# Patient Record
Sex: Female | Born: 1970 | Race: Black or African American | Hispanic: No | Marital: Married | Smoking: Never smoker
Health system: Southern US, Community
[De-identification: ages and names within clinical notes are randomized; demographics above are authoritative.]

## PROBLEM LIST (undated history)

## (undated) DIAGNOSIS — M2550 Pain in unspecified joint: Secondary | ICD-10-CM

## (undated) DIAGNOSIS — F419 Anxiety disorder, unspecified: Secondary | ICD-10-CM

## (undated) DIAGNOSIS — F32A Depression, unspecified: Secondary | ICD-10-CM

## (undated) DIAGNOSIS — K219 Gastro-esophageal reflux disease without esophagitis: Secondary | ICD-10-CM

## (undated) DIAGNOSIS — N943 Premenstrual tension syndrome: Secondary | ICD-10-CM

## (undated) DIAGNOSIS — R600 Localized edema: Secondary | ICD-10-CM

## (undated) DIAGNOSIS — K5900 Constipation, unspecified: Secondary | ICD-10-CM

## (undated) DIAGNOSIS — M549 Dorsalgia, unspecified: Secondary | ICD-10-CM

## (undated) DIAGNOSIS — I10 Essential (primary) hypertension: Secondary | ICD-10-CM

## (undated) HISTORY — PX: TUBAL LIGATION: SHX77

## (undated) HISTORY — DX: Anxiety disorder, unspecified: F41.9

## (undated) HISTORY — DX: Essential (primary) hypertension: I10

## (undated) HISTORY — DX: Dorsalgia, unspecified: M54.9

## (undated) HISTORY — DX: Pain in unspecified joint: M25.50

## (undated) HISTORY — PX: ABLATION: SHX5711

## (undated) HISTORY — DX: Constipation, unspecified: K59.00

## (undated) HISTORY — DX: Localized edema: R60.0

## (undated) HISTORY — DX: Premenstrual tension syndrome: N94.3

## (undated) HISTORY — DX: Gastro-esophageal reflux disease without esophagitis: K21.9

## (undated) HISTORY — PX: TONSILLECTOMY: SUR1361

## (undated) HISTORY — DX: Depression, unspecified: F32.A

---

## 2004-02-07 ENCOUNTER — Other Ambulatory Visit: Admission: RE | Admit: 2004-02-07 | Discharge: 2004-02-07 | Payer: Self-pay | Admitting: Obstetrics and Gynecology

## 2004-04-16 ENCOUNTER — Ambulatory Visit (HOSPITAL_COMMUNITY): Admission: RE | Admit: 2004-04-16 | Discharge: 2004-04-16 | Payer: Self-pay | Admitting: Obstetrics and Gynecology

## 2004-04-16 ENCOUNTER — Encounter (INDEPENDENT_AMBULATORY_CARE_PROVIDER_SITE_OTHER): Payer: Self-pay | Admitting: Specialist

## 2005-08-14 ENCOUNTER — Other Ambulatory Visit: Admission: RE | Admit: 2005-08-14 | Discharge: 2005-08-14 | Payer: Self-pay | Admitting: Obstetrics and Gynecology

## 2005-08-26 ENCOUNTER — Inpatient Hospital Stay (HOSPITAL_COMMUNITY): Admission: AD | Admit: 2005-08-26 | Discharge: 2005-08-26 | Payer: Self-pay | Admitting: Obstetrics and Gynecology

## 2005-09-10 ENCOUNTER — Ambulatory Visit (HOSPITAL_COMMUNITY): Admission: RE | Admit: 2005-09-10 | Discharge: 2005-09-10 | Payer: Self-pay | Admitting: Obstetrics and Gynecology

## 2005-09-10 ENCOUNTER — Encounter (INDEPENDENT_AMBULATORY_CARE_PROVIDER_SITE_OTHER): Payer: Self-pay | Admitting: *Deleted

## 2008-09-15 ENCOUNTER — Ambulatory Visit: Payer: Self-pay | Admitting: Sports Medicine

## 2008-09-15 DIAGNOSIS — M65839 Other synovitis and tenosynovitis, unspecified forearm: Secondary | ICD-10-CM

## 2008-09-15 DIAGNOSIS — M65849 Other synovitis and tenosynovitis, unspecified hand: Secondary | ICD-10-CM

## 2008-09-15 HISTORY — DX: Other synovitis and tenosynovitis, unspecified forearm: M65.839

## 2009-05-04 ENCOUNTER — Encounter: Payer: Self-pay | Admitting: Family Medicine

## 2010-11-16 NOTE — H&P (Signed)
NAMESANDRIKA, Nicole Barber              ACCOUNT NO.:  [id number]   MEDICAL RECORD NO.:  [id number]          PATIENT TYPE:  AMB   LOCATION:  SDC                           FACILITY:  [facility]   PHYSICIAN:  Naima A. Dillard, M.D. DATE OF BIRTH:  03/07/1971   DATE OF ADMISSION:  DATE OF DISCHARGE:                                HISTORY & PHYSICAL   CHIEF COMPLAINT:  Menometrorrhagia and anemia.   HISTORY:  The patient presented in August of 2005 complaining of no menses  for 3 months, and then had a period which was very heavy, where she was  changing a pad every 30 minutes, and she said she slept with Chux on her bed  because of the vaginal bleeding.  She denied any chest pain or shortness of  breath.  She is not using any contraception.  She did not have any history  of fibroids or hormone therapy.  She did not have any new medications or  menopause symptoms or vaginal discharge.  She has had increased stress with  this problem, but no abdominal pain.  The patient recently had a  hysteroscopy and polypectomy.  D&C, hysteroscopy, polypectomy done in  October of 2005, which was significant for endometrial polyps.  The patient  had tried Provera and has been increased up to 40 mg a day, and, despite  that, has still have vaginal bleeding and anemia.  Her last hemoglobin  checked was actually 9.5.  The patient did have Trichomonas seen on her Pap  smear and was treated with Flagyl, but still had this increased bleeding.  The patient had an ultrasound which showed the uterus measuring 10.4 x 4.5 x  6.7.  No fibroids were seen.  The myometrium was slightly inhomogeneous with  some streaking, which could be significant for endomyosis.  The patient's  ovaries were noted to be normal.   PAST SURGICAL HISTORY:  1.  D&C, hysteroscopy, polypectomy in 2005.  2.  Laparoscopy tubal ligation.   PAST GYNECOLOGIC HISTORY:  Significant of menarche at age 41, lasting every  month 6-7.  The patient has a history  of Chlamydia and Trichomonas.   MEDICATIONS:  1.  Provera.  2.  Iron.   ALLERGIES:  No known drug allergies.   FAMILY HISTORY:  Unremarkable.  No GYN or breast cancer.   SOCIAL HISTORY:  The patient denies any alcohol, tobacco, drug or illicit  use.   PAST OBSTETRICAL HISTORY:  Significant for a vaginal delivery x3 without any  complications.   REVIEW OF SYSTEMS:  GENITOURINARY:  As above.  CARDIOVASCULAR:  Unremarkable.  GI:  Unremarkable.  RESPIRATORY:  Unremarkable.  MUSCULOSKELETAL:  Unremarkable.  PSYCHIATRIC:  Unremarkable.  ENDOCRINE:  Unremarkable.   PHYSICAL EXAMINATION:  VITAL SIGNS:  Blood pressure is 106/60.  The patient  had orthostatic blood pressures were normal in February of 2007.  HEENT:  Pupils equal, round and reactive to light.  Hearing is normal.  Throat is clear.  NECK:  Thyroid is not enlarged.  HEART:  Regular rate and rhythm.  CHEST:  Clear to auscultation bilaterally.  BREASTS:  No  masses or discharge, skin change or nipple retraction.  BACK:  No CVA tenderness bilaterally.  ABDOMEN:  Nontender without any masses or organomegaly.  EXTREMITIES:  No clubbing, cyanosis, or edema.  PELVIC:  Full vaginal exam was within normal limits.  Cervix was nontender  without any lesions.  Uterus is top normal size, nontender, with normal  shape.  Adnexa had no masses, nontender.   LABORATORY DATA:  Urine pregnancy test was negative.   ASSESSMENT:  Menometrorrhagia and anemia.   PLAN:  A D&C hysteroscopy, NovaSure ablation.  The patient was given all  options from observation to hormonal treatment to endometrial ablation to  birth control or hormonal forms of treatment to hysterectomy.  The patient  has chosen ablation.  She understands the risks are, but not limited to,  bleeding, infection, damage to internal organs such as bowel or bladder and  major blood vessels.      Naima A. Normand Sloop, M.D.  Electronically Signed     NAD/MEDQ  D:  09/09/2005  T:   09/10/2005  Job:  [id number]

## 2010-11-16 NOTE — Op Note (Signed)
Nicole Barber, Nicole Barber              ACCOUNT NO.:  [id number]   MEDICAL RECORD NO.:  [id number]          PATIENT TYPE:  AMB   LOCATION:  SDC                           [facility]ACILITY:  [facility]   PHYSICIAN:  Naima A. Dillard, M.D. DATE OF BIRTH:  [birth year]   DATE OF PROCEDURE:  04/16/2004  DATE OF DISCHARGE:                                 OPERATIVE REPORT   PREOPERATIVE DIAGNOSES:  Cystic mass in the endometrium.   POSTOPERATIVE DIAGNOSES:  Normal hysteroscopy with no masses noted.   PROCEDURE:  D&C hysteroscopy.   ANESTHESIA:  General laryngeal mask airway.   SURGEON:  Naima A. Dillard, M.D.   ESTIMATED BLOOD LOSS:  Minimal.   IV FLUIDS:  300 mL.   COMPLICATIONS:  None.   There was a 20 mL deficit of hysteroscopic fluid 3% sorbitol.   FINDINGS:  Top normal size anteverted uterus, mobile, nontender, no adnexal  masses palpated bilaterally. The uterus did sound to 10 cm.  10 mL of 1%  lidocaine was used for cervical block. No masses, no submucosal fibroids  noted. Both ostia were visualized. The patient went to the recovery room in  stable condition.   DESCRIPTION OF PROCEDURE:  The patient was taken to the operating room where  she was given general anesthesia with laryngeal mask airway, placed in the  dorsal lithotomy position, prepped and draped in the normal sterile fashion.  The catheter was drained with a red rubber catheter, patient was examined,  the findings noted above were seen. The anterior lip of the cervix was  grasped with a single tooth tenaculum, the uterus was then sounded to 10 cm.  The cervix was then dilated with Shawnie Pons dilators up to 21. The diagnostic  hysteroscope was then placed into the uterus cavity. Before any of this was  done, a bivalve speculum was placed into the vagina, before the anterior lip  of the cervix was grasped with a tenaculum. There were no submucosal  fibroids or cystic lesions noted, both ostia were visualized including the  cervix and  endocervix and endometrium. The hysteroscope was then removed,  sharp curettage was then done and a moderate amount of endometrial  curettings were obtained.  10 mL of 1% lidocaine  was used for cervical block for postop. All instruments were then removed  from the vagina, the tenaculum site was noted to be hemostatic. Sponge, lap  and needle counts were correct x2. The patient went to the recovery room in  stable condition.      NAD/MEDQ  D:  04/16/2004  T:  04/17/2004  Job:  [id number]

## 2010-11-16 NOTE — H&P (Signed)
Nicole Barber, Nicole Barber              ACCOUNT NO.:  [id number]   MEDICAL RECORD NO.:  [id number]          PATIENT TYPE:  AMB   LOCATION:  SDC                           [facility]ILITY[facility]   PHYSICIAN:  Naima A. Dillard, M.D. DATE OF BIRTH:  05-11-1971   DATE OF ADMISSION:  04/16/2004  DATE OF DISCHARGE:                                HISTORY & PHYSICAL   CHIEF COMPLAINT:  Cystic lesion in the endometrium   HISTORY AND PHYSICAL:  The patient is a 40 year old African American female,  gravida 3, para 3, who presented to me in August of 2005 complaining of  headaches and amenorrhea for three months with abdominal pain.  The patient  denied any vaginal discharge,  odor, or fever.  She did state that she had  irregular periods, denied any dyspareunia, did have some dysuria, no urgency  or frequency, no history of kidney stones.  She does have occasional  constipation, no diarrhea, occasional rectal bleeding, no nausea or  vomiting.  She says that she does not believe that she is pregnant.  No  history of fibroids or history of endometriosis.  The patient had no prior  ultrasound or evaluation.  Denies any history of any sexually transmitted  diseases before that visit.   PAST SURGICAL HISTORY:  Significant for a laparoscopic tubal ligation.   PAST GYNECOLOGIC HISTORY:  Significant for menarche at age 69, lasting every  month for six or seven days.  The patient did have a positive Chlamydia test  on February 07, 2004.  __________ is pending.   The patient is on no medications.   She has no known drug allergies.   FAMILY HISTORY:  Unremarkable.  No GYN cancer.   SOCIAL HISTORY:  The patient denies any alcohol, tobacco, or illicit drug  use.   PAST OBSTETRIC HISTORY:  Significant for vaginal delivery x3 without any  complications.   REVIEW OF SYSTEMS:  GENITOURINARY:  As above.  CARDIOVASCULAR:  Unremarkable.  GI:  Unremarkable.  RESPIRATORY:  Unremarkable.  MUSCULOSKELETAL:  Unremarkable.   PSYCHIATRIC:  Unremarkable.   GENERAL APPEARANCE:  Well-developed, well-nourished female in no apparent  distress.  Pupils are equal, hearing is normal, throat is clear.  Thyroid is  not enlarged.  HEART:  Regular rate and rhythm.  LUNGS:  Clear to auscultation bilaterally.  BREASTS:  No masses, discharge, skin changes, or nipple retraction  bilaterally.  BACK:  No CVA tenderness bilaterally.  ABDOMEN:  Nontender without any masses or organomegaly.  EXTREMITIES:  No clubbing, cyanosis, or edema.  NEUROLOGIC:  Exam is within normal limits.  PELVIC:  Vulva/vaginal exam is within normal limits.  Cervix nontender  without lesions.  Uterus is top normal size, shape, and consistency and  nontender.  Adnexa has no masses.   On ultrasound, uterus actually measured 14.5 x 3.6 x 6.8.  Endometrial  stripe is 1.4 cm with a cystic lesion in the endometrium.  This was after  the patient had withdrawal bleed.  Normal adnexae, just had a simple left  ovarian cyst, measuring 1.7 x 1.5 cm.   Her UPT was negative.  Her Chem 9 was also negative.   IMPRESSION:  Amenorrhea, with probable PCOS, with a cystic lesion in the  uterus, could be consistent with uterine hyperplasia.  The patient was  offered sonohystogram versus a D&C hysteroscopy.  The risks and benefits of  both were reviewed.  The patient decided on [facility] hysteroscopy with removal of  cystic lesion.  She understands the risks are, but not limited to bleeding,  infection, damage to internal organs like bowel, bladder, and major blood  vessels, and perforation of the uterus, also Asherman syndrome.      NAD/MEDQ  D:  04/16/2004  T:  04/16/2004  Job:  [id number]

## 2010-11-16 NOTE — Op Note (Signed)
NAMERICHEL, MILLSPAUGH              ACCOUNT NO.:  [id number]   MEDICAL RECORD NO.:  [id number]          PATIENT TYPE:  AMB   LOCATION:  SDC                           [facility]ACILITY:  [facility]   PHYSICIAN:  Naima A. Dillard, M.D. DATE OF BIRTH:  [birth year]   DATE OF PROCEDURE:  09/10/2005  DATE OF DISCHARGE:                                 OPERATIVE REPORT   PREOPERATIVE DIAGNOSES:  1.  Menometrorrhagia.  2.  Anemia.   POSTOPERATIVE DIAGNOSES:  1.  Menometrorrhagia.  2.  Anemia.   OPERATION/PROCEDURE:  1.  Dilatation and curettage and hysteroscopy.  2.  NovaSure ablation.   SURGEON:  Naima A. Dillard, M.D.   ASSISTANT:  None.   ANESTHESIA:  General and local.   SPECIMENS:  Endometrial curettings.   ESTIMATED BLOOD LOSS:  Minimal.   COMPLICATIONS:  None.   DISPOSITION:  The patient went to PACU in stable condition.   DESCRIPTION OF PROCEDURE:  The patient was taken to the operating room where  she was given general anesthesia, placed in the dorsal lithotomy position  and prepped and draped in the normal sterile fashion.  A bivalve speculum  was placed into the vagina.  The anterior lip of the cervix grasped with a  single-tooth tenaculum and 20 mL 1% lidocaine was used for cervical block.  The sounding length was 9 cm.  The cervical length was 4 cm.  Cavity length  was 5 cm.  Cervix was further dilated with Pratt dilators up to 21.  The  hysteroscope was placed into the uterine cavity.  There were no polyps or  fibroids noted.  Both ostia were visualized.  The hysteroscope was removed.  A sharp curettage was done and minimal endometrial curettings were sent to  pathology.  The NovaSure was then placed into the  uterine cavity and after  seating, the cavity length was 4.8.  The test was done to insure a seal  which was fine and the power was 132 and time of ablation was 1 minute 6  seconds.  The NovaSure was removed.  The tenaculum was removed from the  cervix.  There was some  bleeding noticed from the cervix which  was made hemostatic with pressure in silver nitrate.  All instruments were  removed from the vagina.  Sponge, lap and needle counts were correct x2.  The patient went to the recovery room in stable condition. A gonorrhea and  Chlamydia was done before the procedure and sent to the lab.      Naima A. Normand Sloop, M.D.  Electronically Signed     NAD/MEDQ  D:  09/10/2005  T:  09/11/2005  Job:  [id number]

## 2011-08-26 ENCOUNTER — Ambulatory Visit (HOSPITAL_COMMUNITY)
Admission: RE | Admit: 2011-08-26 | Discharge: 2011-08-26 | Disposition: A | Discharge status: Home / Self Care | Payer: BC Managed Care – PPO | Source: Ambulatory Visit | Attending: Family Medicine | Admitting: Family Medicine

## 2011-08-26 ENCOUNTER — Other Ambulatory Visit: Payer: Self-pay | Admitting: Family Medicine

## 2011-08-26 DIAGNOSIS — J40 Bronchitis, not specified as acute or chronic: Secondary | ICD-10-CM

## 2011-08-26 DIAGNOSIS — R05 Cough: Secondary | ICD-10-CM | POA: Insufficient documentation

## 2011-08-26 DIAGNOSIS — R059 Cough, unspecified: Secondary | ICD-10-CM | POA: Insufficient documentation

## 2012-03-10 ENCOUNTER — Other Ambulatory Visit: Payer: Self-pay | Admitting: Family Medicine

## 2012-03-10 DIAGNOSIS — G43909 Migraine, unspecified, not intractable, without status migrainosus: Secondary | ICD-10-CM

## 2012-03-13 ENCOUNTER — Other Ambulatory Visit (HOSPITAL_COMMUNITY): Payer: BC Managed Care – PPO

## 2012-03-17 ENCOUNTER — Ambulatory Visit (HOSPITAL_COMMUNITY)
Admission: RE | Admit: 2012-03-17 | Discharge: 2012-03-17 | Disposition: A | Discharge status: Home / Self Care | Payer: 59 | Source: Ambulatory Visit | Attending: Family Medicine | Admitting: Family Medicine

## 2012-03-17 DIAGNOSIS — R112 Nausea with vomiting, unspecified: Secondary | ICD-10-CM | POA: Insufficient documentation

## 2012-03-17 DIAGNOSIS — G43909 Migraine, unspecified, not intractable, without status migrainosus: Secondary | ICD-10-CM | POA: Insufficient documentation

## 2013-03-11 ENCOUNTER — Emergency Department (HOSPITAL_COMMUNITY)
Admission: EM | Admit: 2013-03-11 | Discharge: 2013-03-11 | Disposition: A | Discharge status: Home / Self Care | Payer: 59 | Attending: Emergency Medicine | Admitting: Emergency Medicine

## 2013-03-11 ENCOUNTER — Emergency Department (HOSPITAL_COMMUNITY): Payer: 59

## 2013-03-11 ENCOUNTER — Ambulatory Visit (INDEPENDENT_AMBULATORY_CARE_PROVIDER_SITE_OTHER): Payer: 59 | Admitting: Nurse Practitioner

## 2013-03-11 ENCOUNTER — Encounter (HOSPITAL_COMMUNITY): Payer: Self-pay

## 2013-03-11 ENCOUNTER — Encounter: Payer: Self-pay | Admitting: Nurse Practitioner

## 2013-03-11 VITALS — BP 120/78 | Temp 98.4°F | Ht 61.0 in | Wt 188.4 lb

## 2013-03-11 DIAGNOSIS — R52 Pain, unspecified: Secondary | ICD-10-CM

## 2013-03-11 DIAGNOSIS — R109 Unspecified abdominal pain: Secondary | ICD-10-CM

## 2013-03-11 DIAGNOSIS — Z3202 Encounter for pregnancy test, result negative: Secondary | ICD-10-CM | POA: Insufficient documentation

## 2013-03-11 DIAGNOSIS — Z79899 Other long term (current) drug therapy: Secondary | ICD-10-CM | POA: Insufficient documentation

## 2013-03-11 DIAGNOSIS — K5900 Constipation, unspecified: Secondary | ICD-10-CM

## 2013-03-11 DIAGNOSIS — Z9851 Tubal ligation status: Secondary | ICD-10-CM | POA: Insufficient documentation

## 2013-03-11 DIAGNOSIS — R1031 Right lower quadrant pain: Secondary | ICD-10-CM | POA: Insufficient documentation

## 2013-03-11 LAB — CBC WITH DIFFERENTIAL/PLATELET
Basophils Absolute: 0 10*3/uL (ref 0.0–0.1)
Eosinophils Absolute: 0.1 10*3/uL (ref 0.0–0.7)
Eosinophils Relative: 1 % (ref 0–5)
HCT: 42.2 % (ref 36.0–46.0)
Hemoglobin: 14.7 g/dL (ref 12.0–15.0)
MCH: 29.9 pg (ref 26.0–34.0)
MCHC: 34.8 g/dL (ref 30.0–36.0)
MCV: 85.9 fL (ref 78.0–100.0)
Neutro Abs: 5.4 10*3/uL (ref 1.7–7.7)
RDW: 12.9 % (ref 11.5–15.5)

## 2013-03-11 LAB — BASIC METABOLIC PANEL
Calcium: 9.3 mg/dL (ref 8.4–10.5)
Chloride: 104 mEq/L (ref 96–112)
Glucose, Bld: 108 mg/dL — ABNORMAL HIGH (ref 70–99)
Potassium: 3.7 mEq/L (ref 3.5–5.1)
Sodium: 138 mEq/L (ref 135–145)

## 2013-03-11 LAB — URINALYSIS, ROUTINE W REFLEX MICROSCOPIC
Bilirubin Urine: NEGATIVE
Glucose, UA: NEGATIVE mg/dL
Protein, ur: NEGATIVE mg/dL
Specific Gravity, Urine: 1.01 (ref 1.005–1.030)
Urobilinogen, UA: 1 mg/dL (ref 0.0–1.0)
pH: 7 (ref 5.0–8.0)

## 2013-03-11 LAB — URINE MICROSCOPIC-ADD ON

## 2013-03-11 MED ORDER — IOHEXOL 300 MG/ML  SOLN
50.0000 mL | Freq: Once | INTRAMUSCULAR | Status: AC | PRN
  Administered 2013-03-11: 50 mL via ORAL

## 2013-03-11 MED ORDER — SODIUM CHLORIDE 0.9 % IV SOLN
Freq: Once | INTRAVENOUS | Status: AC
  Administered 2013-03-11: 19:00:00 via INTRAVENOUS

## 2013-03-11 MED ORDER — IOHEXOL 300 MG/ML  SOLN
100.0000 mL | Freq: Once | INTRAMUSCULAR | Status: AC | PRN
  Administered 2013-03-11: 100 mL via INTRAVENOUS

## 2013-03-11 NOTE — ED Notes (Signed)
Pt c/o pain in lower abd x 3 days.  Reports nausea initially but none in the past 2 days.  Denies vomiting or diarrhea.   LBM was approx 1 1/2 weeks ago.  Pt says usually goes that long without having a bm.  Denies any abnormal vaginal bleeding or discharge.  LMP was 3 months ago.  Pt says had uterine ablation and doesn't have regular periods.

## 2013-03-11 NOTE — ED Notes (Signed)
X2 unsuccessful IV attempts made. Vernell Barrier RN to room to attempt IV access.

## 2013-03-11 NOTE — ED Provider Notes (Signed)
CSN: [id number]     Arrival date & time 03/11/13  1553 History  This chart was scribed for Geoffery Lyons, MD by Clydene Laming, ED Scribe and Bennett Scrape, ED Scribe. This patient was seen in room APA02/APA02 and the patient's care was started at 5:28 PM.   Chief Complaint  Patient presents with  . Abdominal Pain    The history is provided by the patient. No language interpreter was used.    HPI Comments: Nicole Barber is a 42 y.o. female who presents to the Emergency Department complaining of constant, sharp, lower abdominal pain that has been occuring for three days with associated constipation. She states her LBM was 1.5 weeks ago. Pt states she has experienced episodes of nausea but not for the past two days. Pt states she has not had a similar episode of this before. Pt denies trouble urinating, emesis, diarrhea, and fever, vaginal bleeding, or discharge. Menses are irregular at baseline due to ablation surgery. Last menses was 3 weeks ago.  History reviewed. No pertinent past medical history. Past Surgical History  Procedure Laterality Date  . Tubal ligation    . Ablation    . Tonsillectomy     No family history on file. History  Substance Use Topics  . Smoking status: Never Smoker   . Smokeless tobacco: Not on file  . Alcohol Use: No   No OB history provided.  Review of Systems  Constitutional: Negative for fever.  Gastrointestinal: Positive for abdominal pain and constipation. Negative for vomiting and diarrhea.  Genitourinary: Negative for vaginal bleeding, vaginal discharge and difficulty urinating.  All other systems reviewed and are negative.    Allergies  Review of patient's allergies indicates no known allergies.  Home Medications   Current Outpatient Rx  Name  Route  Sig  Dispense  Refill  . topiramate (TOPAMAX) 100 MG tablet   Oral   Take 100 mg by mouth 2 (two) times daily.          Triage Vitals: BP 139/83  Pulse 99  Temp(Src) 98.1 F (36.7  C) (Oral)  Resp 18  Ht 5\' 1"  (1.549 m)  Wt 181 lb (82.101 kg)  BMI 34.22 kg/m2  SpO2 98% Physical Exam  Nursing note and vitals reviewed. Constitutional: She is oriented to person, place, and time. She appears well-developed and well-nourished. No distress.  HENT:  Head: Normocephalic and atraumatic.  Eyes: Conjunctivae and EOM are normal.  Neck: Normal range of motion. Neck supple. No tracheal deviation present.  Cardiovascular: Normal rate, regular rhythm and normal heart sounds.   No murmur heard. Pulmonary/Chest: Effort normal and breath sounds normal. No respiratory distress. She has no wheezes. She has no rales.  Abdominal: Soft. Bowel sounds are normal. There is tenderness.  Ttp in the right lower quadrant without rebound or guarding. Bowel sounds present. No cva tenderness.   Musculoskeletal: Normal range of motion. She exhibits no edema.  Neurological: She is alert and oriented to person, place, and time. No cranial nerve deficit.  Skin: Skin is warm and dry.  Psychiatric: She has a normal mood and affect. Her behavior is normal.    ED Course  Procedures (including critical care time)  DIAGNOSTIC STUDIES: Oxygen Saturation is 98% on RA, normal by my interpretation.    COORDINATION OF CARE: 5:40 PM-  Pt declines pain medications. Discussed treatment plan with pt at bedside. Pt verbalized understanding and agreement with plan.   Labs Review Labs Reviewed  BASIC METABOLIC PANEL -  Abnormal; Notable for the following:    Glucose, Bld 108 (*)    All other components within normal limits  CBC WITH DIFFERENTIAL  URINALYSIS, ROUTINE W REFLEX MICROSCOPIC  PREGNANCY, URINE   Imaging Review No results found.  MDM  No diagnosis found. This patient presents with complaints of right lower abdominal pain for the past 3 days. She seen by her doctor and sent here for further workup. She denies any fevers or chills. She denies urinary complaints. There is no vaginal bleeding or  discharge. Exam reveals tenderness to palpation in the right lower quadrant. There is no rebound and no guarding. Workup is essentially unremarkable, including CBC, urinalysis, pregnancy test, white count. A CT of the abdomen and pelvis was performed which did not reveal appendicitis or other intra-abdominal pathology. She tells me she has not had a bowel movement in over one week. I suspect this may be the cause of her discomfort. I will recommend magnesium citrate and followup as needed should her symptoms worsen or change.   I personally performed the services described in this documentation, which was scribed in my presence. The recorded information has been reviewed and is accurate.     Geoffery Lyons, MD 03/11/13 2023

## 2013-03-11 NOTE — Progress Notes (Signed)
Subjective:  Presents with complaints of right-sided abdominal pain especially in the right lower quadrant that woke her up from a sleep 3 days ago. Describes as a constant ache with occasional very sharp pain 8/10 on pain scale. Comparison to labor pain. No fever. Nausea has resolved. No vomiting. Decreased appetite. Taking fluids well. No urinary symptoms. Her normal bowel pattern is twice every 2 weeks, her last BM was 1 1/2 weeks ago. Pain is worse with sitting and lying down. No back pain. No reflux symptoms. PMH includes tubal ligation and endometrial ablation.  Objective:   BP 120/78  Temp(Src) 98.4 F (36.9 C) (Oral)  Ht 5\' 1"  (1.549 m)  Wt 188 lb 6 oz (85.446 kg)  BMI 35.61 kg/m2 NAD. Alert, oriented. Patient in obvious pain, leaning back while sitting on table to avoid pressure on her right side. Lungs clear. Heart regular rate rhythm. Abdomen soft nondistended with hypoactive bowel sounds. Nontender left side to midline. Moderate tenderness right upper quadrant which is progressively worse towards the right lower quadrant which is exquisitely tender to palpation.  Assessment:Abdominal pain, acute  Plan: Due to the nature and severity of patient's pain, referred to local ER for further evaluation. A call was made to the triage nurse to make her aware that the patient will be coming over.

## 2014-07-29 ENCOUNTER — Ambulatory Visit (INDEPENDENT_AMBULATORY_CARE_PROVIDER_SITE_OTHER): Payer: BLUE CROSS/BLUE SHIELD | Admitting: Family Medicine

## 2014-07-29 ENCOUNTER — Encounter: Payer: Self-pay | Admitting: Family Medicine

## 2014-07-29 VITALS — BP 128/88 | Temp 98.5°F | Ht 61.0 in | Wt 193.0 lb

## 2014-07-29 DIAGNOSIS — J208 Acute bronchitis due to other specified organisms: Secondary | ICD-10-CM

## 2014-07-29 DIAGNOSIS — J4521 Mild intermittent asthma with (acute) exacerbation: Secondary | ICD-10-CM

## 2014-07-29 MED ORDER — BECLOMETHASONE DIPROPIONATE 80 MCG/ACT IN AERS: 2.0000 | INHALATION_SPRAY | Freq: Two times a day (BID) | RESPIRATORY_TRACT | Status: DC

## 2014-07-29 MED ORDER — LEVOFLOXACIN 500 MG PO TABS: 500.0000 mg | ORAL_TABLET | Freq: Every day | ORAL | Status: DC

## 2014-07-29 NOTE — Progress Notes (Signed)
   Subjective:    Patient ID: Nicole Barber, female    DOB: 09/12/1970, 44 y.o.   MRN: [id number]  Cough Associated symptoms include a fever, headaches and wheezing. Treatments tried: amoxil, biaxin, prednisone, inhaler, tessalon, cough syrup.  Went to urgent care 3 times. They did chest xray.  Discussed her illness is been going on for about 3 weeks start off and had been went to just intermittent chest congestion as well had x-ray was negative for any type of pneumonia she was first treated with amoxicillin then with Biaxin then with albuterol   Review of Systems  Constitutional: Positive for fever.  Respiratory: Positive for cough and wheezing.   Neurological: Positive for headaches.       Objective:   Physical Exam  Frequent cough is noted no respiratory distress noted heart regular. HEENT benign  Not respiratory distress    Assessment & Plan:  Recent sinus infection Bronchitis-Levaquin 14 days Possible asthmatic bronchitis Albuterol as rescue medicine Steroid inhaler on a regular basis follow-up 2 weeks May need pulmonary function testing on follow-up Have requested old records from recent visits to urgent care center including chest x-ray

## 2014-08-11 ENCOUNTER — Ambulatory Visit (INDEPENDENT_AMBULATORY_CARE_PROVIDER_SITE_OTHER): Payer: BLUE CROSS/BLUE SHIELD | Admitting: Family Medicine

## 2014-08-11 ENCOUNTER — Encounter: Payer: Self-pay | Admitting: Family Medicine

## 2014-08-11 VITALS — BP 112/70 | Ht 61.0 in | Wt 192.0 lb

## 2014-08-11 DIAGNOSIS — J208 Acute bronchitis due to other specified organisms: Secondary | ICD-10-CM

## 2014-08-11 NOTE — Progress Notes (Signed)
   Subjective:    Patient ID: Nicole Barber, female    DOB: October 16, 1970, 44 y.o.   MRN: [id number]  Cough  Pt is here today for a recheck on bronchitis from 2 weeks ago. She said she feels so much better, but still has lingering cough. Worst at night.  Taking meds prescribed.     Review of Systems  Respiratory: Positive for cough.    no fever no vomiting no diarrhea.     Objective:   Physical Exam   lungs are clear hearts regular pulse normal occasional cough noted      Assessment & Plan:   bronchitis much better this should gradually dissipate to no coughing over the next 2 weeks if worse call back we may need to send in some additional medication possibly Biaxin   to use the steroid inhaler couple times per day over the course of the next 4 weeks use albuterol when necessary

## 2014-08-17 ENCOUNTER — Telehealth: Payer: Self-pay | Admitting: Family Medicine

## 2014-08-17 MED ORDER — CLARITHROMYCIN 500 MG PO TABS: 500.0000 mg | ORAL_TABLET | Freq: Two times a day (BID) | ORAL | Status: DC

## 2014-08-17 NOTE — Telephone Encounter (Signed)
Medication was sent in to the pharmacy. Left message on voicemail notifying patient.

## 2014-08-17 NOTE — Telephone Encounter (Signed)
Pt states she had a follow up OV on 2/11 An was told to call back if her cough did not get any better  She would like something called into [facility]

## 2014-08-17 NOTE — Telephone Encounter (Signed)
Please send in Biaxin 500 mg, 1 twice a day for 10 days, I recommend for the patient to follow-up if she is not well with that. Certainly sooner if problems. Take medication with food and plenty of water

## 2014-11-09 ENCOUNTER — Encounter: Payer: Self-pay | Admitting: Family Medicine

## 2014-11-09 ENCOUNTER — Ambulatory Visit (INDEPENDENT_AMBULATORY_CARE_PROVIDER_SITE_OTHER): Payer: BLUE CROSS/BLUE SHIELD | Admitting: Family Medicine

## 2014-11-09 VITALS — BP 128/90 | Temp 98.5°F | Ht 61.0 in | Wt 192.0 lb

## 2014-11-09 DIAGNOSIS — G8929 Other chronic pain: Secondary | ICD-10-CM

## 2014-11-09 DIAGNOSIS — J4520 Mild intermittent asthma, uncomplicated: Secondary | ICD-10-CM | POA: Diagnosis not present

## 2014-11-09 DIAGNOSIS — J019 Acute sinusitis, unspecified: Secondary | ICD-10-CM | POA: Diagnosis not present

## 2014-11-09 DIAGNOSIS — R51 Headache: Secondary | ICD-10-CM | POA: Diagnosis not present

## 2014-11-09 DIAGNOSIS — J45909 Unspecified asthma, uncomplicated: Secondary | ICD-10-CM | POA: Insufficient documentation

## 2014-11-09 DIAGNOSIS — R519 Headache, unspecified: Secondary | ICD-10-CM

## 2014-11-09 DIAGNOSIS — B9689 Other specified bacterial agents as the cause of diseases classified elsewhere: Secondary | ICD-10-CM

## 2014-11-09 HISTORY — DX: Headache, unspecified: R51.9

## 2014-11-09 HISTORY — DX: Unspecified asthma, uncomplicated: J45.909

## 2014-11-09 MED ORDER — LEVOFLOXACIN 500 MG PO TABS: 500.0000 mg | ORAL_TABLET | Freq: Every day | ORAL | Status: DC

## 2014-11-09 MED ORDER — ALBUTEROL SULFATE HFA 108 (90 BASE) MCG/ACT IN AERS: 2.0000 | INHALATION_SPRAY | Freq: Four times a day (QID) | RESPIRATORY_TRACT | Status: DC | PRN

## 2014-11-09 MED ORDER — FLUTICASONE PROPIONATE 50 MCG/ACT NA SUSP: 2.0000 | Freq: Every day | NASAL | Status: DC

## 2014-11-09 NOTE — Progress Notes (Signed)
   Subjective:    Patient ID: Nicole Barber, female    DOB: 12/08/1970, 44 y.o.   MRN: [id number]  Cough This is a new problem. Episode onset: 5 days. Associated symptoms include ear pain, a fever, headaches, nasal congestion, rhinorrhea and a sore throat. Pertinent negatives include no chest pain, shortness of breath or wheezing. Treatments tried: zyrtec, alka seltzer.   She is complaining of sinus pressure pain and discomfort Using zyrtec and alka seltzer plus Patient has relatively frequent head congestion and coughing  Review of Systems  Constitutional: Positive for fever. Negative for activity change.  HENT: Positive for congestion, ear pain, rhinorrhea and sore throat.   Eyes: Negative for discharge.  Respiratory: Positive for cough. Negative for shortness of breath and wheezing.   Cardiovascular: Negative for chest pain.  Neurological: Positive for headaches.       Objective:   Physical Exam  Constitutional: She appears well-developed.  HENT:  Head: Normocephalic.  Nose: Nose normal.  Mouth/Throat: Oropharynx is clear and moist. No oropharyngeal exudate.  Neck: Neck supple.  Cardiovascular: Normal rate and normal heart sounds.   No murmur heard. Pulmonary/Chest: Effort normal and breath sounds normal. She has no wheezes.  Lymphadenopathy:    She has no cervical adenopathy.  Skin: Skin is warm and dry.  Nursing note and vitals reviewed.         Assessment & Plan:  Patient overall doing fairly well. I am concerned about reoccurrence of lung related issues. Apparently she only use the steroid inhaler for a week or 2 after the last illness then stopped it I told her she ought to take it on a regular basis for at least the next few months  Allergic rhinitis OTC medications ear tack along with Flonase Antibiotics for sinusitis Impersistence of illness may need shot of steroids or a short taper of oral steroids patient will call back

## 2015-06-21 ENCOUNTER — Telehealth: Payer: Self-pay | Admitting: Family Medicine

## 2015-06-21 NOTE — Telephone Encounter (Signed)
Patient last seen 5/16-needs office visit

## 2015-06-21 NOTE — Telephone Encounter (Signed)
Left message to return call 

## 2015-06-21 NOTE — Telephone Encounter (Signed)
Patient scheduled office visit 06/22/15

## 2015-06-21 NOTE — Telephone Encounter (Signed)
Pt had lots of bronchitis issues from 05/2014-10/2014, now has cough for 2 weeks, using inhaler, not clearing up Would like to know if we can call in a 5 day Prednisone taper?  Please advise   [facility]

## 2015-06-22 ENCOUNTER — Encounter: Payer: Self-pay | Admitting: Family Medicine

## 2015-06-22 ENCOUNTER — Ambulatory Visit (INDEPENDENT_AMBULATORY_CARE_PROVIDER_SITE_OTHER): Payer: BLUE CROSS/BLUE SHIELD | Admitting: Family Medicine

## 2015-06-22 VITALS — BP 134/84 | Temp 98.6°F | Ht 61.0 in | Wt 193.2 lb

## 2015-06-22 DIAGNOSIS — J4541 Moderate persistent asthma with (acute) exacerbation: Secondary | ICD-10-CM

## 2015-06-22 DIAGNOSIS — B9689 Other specified bacterial agents as the cause of diseases classified elsewhere: Secondary | ICD-10-CM

## 2015-06-22 DIAGNOSIS — J45901 Unspecified asthma with (acute) exacerbation: Secondary | ICD-10-CM

## 2015-06-22 DIAGNOSIS — J019 Acute sinusitis, unspecified: Secondary | ICD-10-CM

## 2015-06-22 HISTORY — DX: Unspecified asthma with (acute) exacerbation: J45.901

## 2015-06-22 MED ORDER — BECLOMETHASONE DIPROPIONATE 80 MCG/ACT IN AERS: 2.0000 | INHALATION_SPRAY | Freq: Two times a day (BID) | RESPIRATORY_TRACT | Status: DC

## 2015-06-22 MED ORDER — LEVOFLOXACIN 500 MG PO TABS: ORAL_TABLET | ORAL | Status: DC

## 2015-06-22 MED ORDER — ALBUTEROL SULFATE HFA 108 (90 BASE) MCG/ACT IN AERS: 2.0000 | INHALATION_SPRAY | Freq: Four times a day (QID) | RESPIRATORY_TRACT | Status: DC | PRN

## 2015-06-22 MED ORDER — PREDNISONE 20 MG PO TABS: ORAL_TABLET | ORAL | Status: DC

## 2015-06-22 NOTE — Progress Notes (Signed)
   Subjective:    Patient ID: Nicole Barber, female    DOB: 06/12/1971, 44 y.o.   MRN: [id number]  Cough This is a new problem. The current episode started in the past 7 days. The cough is non-productive. Associated symptoms comments: Congestion. Treatments tried: Cough drops.   Patient states no other concerns this visit. Notes tightness and cong  In office with others no one else sick   Patient notes a chronic tendency towards as the past couple years. All prior notes reviewed today and presence of patient.  Patient reports positive family history of asthma with multiple relatives. Next  Nonsmoker.  Not around anybody else sick lately  Cough for a couple weeks now settled into the chest with tightness. Using albuterol couple times a day. Next  Had gotten off the Qvar. Next  Patient states she has not been told yet that she has true asthma  Review of Systems  Respiratory: Positive for cough.    No vomiting no diarrhea slight nasal congestion ROS otherwise negative    Objective:   Physical Exam  Alert vitals stable HEENT mild nasal congestion. Normal neck supple. Lungs wheeze with cough no tachypnea no crackles heart regular in rhythm.      Assessment & Plan:  Impression 1 subacute rhinosinusitis/bronchitis #2 asthma discussed this now for several distinct episodes in the last year with exacerbation reactive airways. Plan initiate Qvar once again. Prednisone taper. Antibiotics prescribed. Avoidance measures discussed. Flu shot recommended yearly. Patient educated on nature of asthma recheck if persists maintain Qvar couple months and then try to wean off discussed warning signs discussed easily 25 minutes spent most in discussion

## 2015-11-01 ENCOUNTER — Encounter: Payer: Self-pay | Admitting: Allergy and Immunology

## 2015-11-01 ENCOUNTER — Ambulatory Visit (INDEPENDENT_AMBULATORY_CARE_PROVIDER_SITE_OTHER): Payer: BLUE CROSS/BLUE SHIELD | Admitting: Allergy and Immunology

## 2015-11-01 VITALS — BP 114/78 | HR 76 | Temp 98.2°F | Resp 20 | Ht 62.4 in | Wt 198.4 lb

## 2015-11-01 DIAGNOSIS — R05 Cough: Secondary | ICD-10-CM | POA: Diagnosis not present

## 2015-11-01 DIAGNOSIS — J3089 Other allergic rhinitis: Secondary | ICD-10-CM | POA: Diagnosis not present

## 2015-11-01 DIAGNOSIS — J4540 Moderate persistent asthma, uncomplicated: Secondary | ICD-10-CM | POA: Diagnosis not present

## 2015-11-01 DIAGNOSIS — K219 Gastro-esophageal reflux disease without esophagitis: Secondary | ICD-10-CM | POA: Diagnosis not present

## 2015-11-01 DIAGNOSIS — R053 Chronic cough: Secondary | ICD-10-CM | POA: Insufficient documentation

## 2015-11-01 MED ORDER — MONTELUKAST SODIUM 10 MG PO TABS: 10.0000 mg | ORAL_TABLET | Freq: Every day | ORAL | Status: DC

## 2015-11-01 MED ORDER — OMEPRAZOLE MAGNESIUM 20 MG PO TBEC: 20.0000 mg | DELAYED_RELEASE_TABLET | Freq: Every day | ORAL | Status: DC

## 2015-11-01 MED ORDER — BUDESONIDE-FORMOTEROL FUMARATE 160-4.5 MCG/ACT IN AERO: 2.0000 | INHALATION_SPRAY | Freq: Two times a day (BID) | RESPIRATORY_TRACT | Status: DC

## 2015-11-01 MED ORDER — AZELASTINE HCL 0.15 % NA SOLN: NASAL | Status: DC

## 2015-11-01 NOTE — Assessment & Plan Note (Addendum)
   Aeroallergen avoidance measures have been discussed and provided in written form.  A prescription has been provided for Astepro (azelastine) nasal spray, 1-2 sprays per nostril 2 times daily as needed. Proper nasal spray technique has been discussed and demonstrated.   Nasal saline lavage (NeilMed) as needed has been recommended along with instructions for proper administration.  A prescription has been provided for montelukast (as above). The risks and benefits of aeroallergen immunotherapy have been discussed. The patient is motivated to initiate immunotherapy if insurance coverage is favorable. She will let us know how she would like to proceed.

## 2015-11-01 NOTE — Patient Instructions (Addendum)
Moderate persistent asthma  A prescription has been provided for Symbicort (budesonide/formoterol) 160/4.5 g, 2 inhalations twice a day.  To maximize pulmonary deposition, a spacer has been provided along with instructions for its proper administration with an HFA inhaler.  A prescription has been provided for montelukast 10 mg daily at bedtime.  Continue albuterol every 4-6 hours as needed.  Subjective and objective measures of pulmonary function will be followed and the treatment plan will be adjusted accordingly.  Perennial and seasonal allergic rhinitis  Aeroallergen avoidance measures have been discussed and provided in written form.  A prescription has been provided for Astepro (azelastine) nasal spray, 1-2 sprays per nostril 2 times daily as needed. Proper nasal spray technique has been discussed and demonstrated.   Nasal saline lavage (NeilMed) as needed has been recommended along with instructions for proper administration.  A prescription has been provided for montelukast (as above). The risks and benefits of aeroallergen immunotherapy have been discussed. The patient is motivated to initiate immunotherapy if insurance coverage is favorable. She will let us know how she would like to proceed.  GERD (gastroesophageal reflux disease)  Appropriate reflux lifestyle modifications have been provided and discussed.  A prescription has been provided for omeprazole 20 mg daily 30 minutes prior to breakfast.  Persistent cough The most common causes of chronic cough include the following: upper airway cough syndrome (UACS) which is caused by variety of rhinosinus conditions; asthma; gastroesophageal reflux disease (GERD); chronic bronchitis from cigarette smoking or other inhaled environmental irritants; non-asthmatic eosinophilic bronchitis; and bronchiectasis. In prospective studies, these conditions have accounted for up to 94% of the causes of chronic cough in immunocompetent  adults. The history and physical examination suggest that her cough is multifactorial with contribution from postnasal drainage, bronchial hyperresponsiveness, and acid reflux. We will address these issues at this time.   Treatment plan as outlined above.  We will regroup in 6 weeks to assess treatment response and adjust therapy accordingly.    Return in about 6 weeks (around 12/13/2015), or if symptoms worsen or fail to improve.   Reducing Pollen Exposure  The American Academy of Allergy, Asthma and Immunology suggests the following steps to reduce your exposure to pollen during allergy seasons.    1. Do not hang sheets or clothing out to dry; pollen may collect on these items. 2. Do not mow lawns or spend time around freshly cut grass; mowing stirs up pollen. 3. Keep windows closed at night.  Keep car windows closed while driving. 4. Minimize morning activities outdoors, a time when pollen counts are usually at their highest. 5. Stay indoors as much as possible when pollen counts or humidity is high and on windy days when pollen tends to remain in the air longer. 6. Use air conditioning when possible.  Many air conditioners have filters that trap the pollen spores. 7. Use a HEPA room air filter to remove pollen form the indoor air you breathe.   Control of Dog or Cat Allergen  Avoidance is the best way to manage a dog or cat allergy. If you have a dog or cat and are allergic to dog or cats, consider removing the dog or cat from the home. If you have a dog or cat but don't want to find it a new home, or if your family wants a pet even though someone in the household is allergic, here are some strategies that may help keep symptoms at bay:  1. Keep the pet out of your bedroom and  restrict it to only a few rooms. Be advised that keeping the dog or cat in only one room will not limit the allergens to that room. 2. Don't pet, hug or kiss the dog or cat; if you do, wash your hands with soap  and water. 3. High-efficiency particulate air (HEPA) cleaners run continuously in a bedroom or living room can reduce allergen levels over time. 4. Regular use of a high-efficiency vacuum cleaner or a central vacuum can reduce allergen levels. 5. Giving your dog or cat a bath at least once a week can reduce airborne allergen.  Control of Mold Allergen  Mold and fungi can grow on a variety of surfaces provided certain temperature and moisture conditions exist.  Outdoor molds grow on plants, decaying vegetation and soil.  The major outdoor mold, Alternaria and Cladosporium, are found in very high numbers during hot and dry conditions.  Generally, a late Summer - Fall peak is seen for common outdoor fungal spores.  Rain will temporarily lower outdoor mold spore count, but counts rise rapidly when the rainy period ends.  The most important indoor molds are Aspergillus and Penicillium.  Dark, humid and poorly ventilated basements are ideal sites for mold growth.  The next most common sites of mold growth are the bathroom and the kitchen.  Outdoor MicrosoftMold Control 1. Use air conditioning and keep windows closed 2. Avoid exposure to decaying vegetation. 3. Avoid leaf raking. 4. Avoid grain handling. 5. Consider wearing a face mask if working in moldy areas.  Indoor Mold Control 1. Maintain humidity below 50%. 2. Clean washable surfaces with 5% bleach solution. 3. Remove sources e.g. Contaminated carpets.  Lifestyle Changes for Controlling GERD  When you have GERD, stomach acid feels as if it's backing up toward your mouth. Whether or not you take medication to control your GERD, your symptoms can often be improved with lifestyle changes.   Raise Your Head  Reflux is more likely to strike when you're lying down flat, because stomach fluid can  flow backward more easily. Raising the head of your bed 4-6 inches can help. To do this:  Slide blocks or books under the legs at the head of your bed.  Or, place a wedge under  the mattress. Many foam stores can make a suitable wedge for you. The wedge  should run from your waist to the top of your head.  Don't just prop your head on several pillows. This increases pressure on your  stomach. It can make GERD worse.  Watch Your Eating Habits Certain foods may increase the acid in your stomach or relax the lower esophageal sphincter, making GERD more likely. It's best to avoid the following:  Coffee, tea, and carbonated drinks (with and without caffeine)  Fatty, fried, or spicy food  Mint, chocolate, onions, and tomatoes  Any other foods that seem to irritate your stomach or cause you pain  Relieve the Pressure  Eat smaller meals, even if you have to eat more often.  Don't lie down right after you eat. Wait a few hours for your stomach to empty.  Avoid tight belts and tight-fitting clothes.  Lose excess weight.  Tobacco and Alcohol  Avoid smoking tobacco and drinking alcohol. They can make GERD symptoms worse.

## 2015-11-01 NOTE — Assessment & Plan Note (Signed)
   A prescription has been provided for Symbicort (budesonide/formoterol) 160/4.5 g, 2 inhalations twice a day.  To maximize pulmonary deposition, a spacer has been provided along with instructions for its proper administration with an HFA inhaler.  A prescription has been provided for montelukast 10 mg daily at bedtime.  Continue albuterol every 4-6 hours as needed.  Subjective and objective measures of pulmonary function will be followed and the treatment plan will be adjusted accordingly.

## 2015-11-01 NOTE — Progress Notes (Addendum)
New Patient Note  RE: Nicole Barber MRN: [id number] DOB: [birth year] Date of Office Visit: 11/01/2015  Referring provider: Babs SciaraLuking, Scott A, MD Primary care provider: Lilyan PuntScott Luking, MD  Chief Complaint: Asthma and Allergic Rhinitis    History of present illness: HPI Comments: Nicole Barber is a 45 y.o. female presenting today for consultation of asthma and rhinitis.  She reports that she was diagnosed with asthma in 2016.  Her symptoms consist of chest tightness, wheezing, and coughing "all the time."  The cough is described as nonproductive and the coughing and wheezing tend to be worse at nighttime.  Her asthma symptoms are triggered by paint fumes, chemicals, dust, perfumes, and pollen exposure.  She has never been hospitalized or intubated for asthma exacerbations but over the past year went to the urgent care 3 or 4 times and was prescribed prednisone 7 or 8 times.  She uses Qvar sporadically and does not use a spacer device with HFA inhalers.  She also experiences nasal congestion, nasal pruritus, postnasal drainage, throat clearing, and itchy/watery eyes.  She was prescribed fluticasone nasal spray in the past but stopped using it 2 years ago because of epistaxis from this medication.  Her nasal/ocular symptoms occur year around but her more severe in the springtime and fall.  She admits to heartburn.   Assessment and plan: Moderate persistent asthma  A prescription has been provided for Symbicort (budesonide/formoterol) 160/4.5 g, 2 inhalations twice a day.  To maximize pulmonary deposition, a spacer has been provided along with instructions for its proper administration with an HFA inhaler.  A prescription has been provided for montelukast 10 mg daily at bedtime.  Continue albuterol every 4-6 hours as needed.  Subjective and objective measures of pulmonary function will be followed and the treatment plan will be adjusted accordingly.  Perennial and seasonal  allergic rhinitis  Aeroallergen avoidance measures have been discussed and provided in written form.  A prescription has been provided for Astepro (azelastine) nasal spray, 1-2 sprays per nostril 2 times daily as needed. Proper nasal spray technique has been discussed and demonstrated.   Nasal saline lavage (NeilMed) as needed has been recommended along with instructions for proper administration.  A prescription has been provided for montelukast (as above). The risks and benefits of aeroallergen immunotherapy have been discussed. The patient is motivated to initiate immunotherapy if insurance coverage is favorable. She will let us know how she would like to proceed.  GERD (gastroesophageal reflux disease)  Appropriate reflux lifestyle modifications have been provided and discussed.  A prescription has been provided for omeprazole 20 mg daily 30 minutes prior to breakfast.  Persistent cough The most common causes of chronic cough include the following: upper airway cough syndrome (UACS) which is caused by variety of rhinosinus conditions; asthma; gastroesophageal reflux disease (GERD); chronic bronchitis from cigarette smoking or other inhaled environmental irritants; non-asthmatic eosinophilic bronchitis; and bronchiectasis. In prospective studies, these conditions have accounted for up to 94% of the causes of chronic cough in immunocompetent adults. The history and physical examination suggest that her cough is multifactorial with contribution from postnasal drainage, bronchial hyperresponsiveness, and acid reflux. We will address these issues at this time.   Treatment plan as outlined above.  We will regroup in 6 weeks to assess treatment response and adjust therapy accordingly.    Meds ordered this encounter  Medications  . Azelastine HCl 0.15 % SOLN    Sig: 1-2 sprays each nostril twice daily as needed for nasal congestion  Dispense:  30 mL    Refill:  5  .  budesonide-formoterol (SYMBICORT) 160-4.5 MCG/ACT inhaler    Sig: Inhale 2 puffs into the lungs 2 (two) times daily.    Dispense:  1 Inhaler    Refill:  5    Place on hold until patient needs  . montelukast (SINGULAIR) 10 MG tablet    Sig: Take 1 tablet (10 mg total) by mouth at bedtime.    Dispense:  30 tablet    Refill:  5  . omeprazole (PRILOSEC OTC) 20 MG tablet    Sig: Take 1 tablet (20 mg total) by mouth daily.    Dispense:  30 tablet    Refill:  5    Diagnositics: Spirometry: FVC was 2.40 L and FEV1 was 2.13 L (92% predicted) without postbronchodilator improvement. Allergy skin testing: Positive to grass pollen, weed pollen, ragweed pollen, tree pollen, mold, cat hair, and dog epithelia.    Physical examination: Blood pressure 114/78, pulse 76, temperature 98.2 F (36.8 C), resp. rate 20, height 5' 2.4" (1.585 m), weight 198 lb 6.6 oz (90 kg).  General: Alert, interactive, in no acute distress. HEENT: TMs pearly gray, turbinates edematous and pale with clear discharge, post-pharynx erythematous and crowded. Neck: Supple without lymphadenopathy. Lungs: Mildly decreased breath sounds bilaterally without wheezing, rhonchi or rales. CV: Normal S1, S2 without murmurs. Abdomen: Nondistended, nontender. Skin: Warm and dry, without lesions or rashes. Extremities:  No clubbing, cyanosis or edema. Neuro:   Grossly intact.  Review of systems:  Review of Systems  Constitutional: Negative for fever, chills and weight loss.  HENT: Positive for congestion. Negative for nosebleeds.   Eyes: Negative for blurred vision.  Respiratory: Positive for cough, shortness of breath and wheezing. Negative for hemoptysis.   Cardiovascular: Negative for chest pain.  Gastrointestinal: Positive for heartburn. Negative for diarrhea and constipation.  Genitourinary: Negative for dysuria.  Musculoskeletal: Negative for myalgias and joint pain.  Neurological: Negative for dizziness.    Endo/Heme/Allergies: Positive for environmental allergies. Does not bruise/bleed easily.    Past medical history:  Other than issues mentioned in the history of present illness, no chronic diseases or recent hospitalizations have been reported.  Past surgical history:  Past Surgical History  Procedure Laterality Date  . Tubal ligation    . Ablation    . Tonsillectomy      Family history: Family History  Problem Relation Age of Onset  . Asthma Mother   . Diabetes Father   . Heart disease Father   . Allergic rhinitis Brother     Social history: Social History   Social History  . Marital Status: Married    Spouse Name: N/A  . Number of Children: N/A  . Years of Education: N/A   Occupational History  . Not on file.   Social History Main Topics  . Smoking status: Never Smoker   . Smokeless tobacco: Not on file  . Alcohol Use: No  . Drug Use: No  . Sexual Activity: Not on file   Other Topics Concern  . Not on file   Social History Narrative   Environmental History: The patient lives in a 43 year old house with tiled floors throughout and central air/heat.  There are dogs in house which have access to her bedroom.  She is a nonsmoker.    Medication List       This list is accurate as of: 11/01/15 11:59 PM.  Always use your most recent med list.  albuterol 108 (90 Base) MCG/ACT inhaler  Commonly known as:  PROVENTIL HFA;VENTOLIN HFA  Inhale 2 puffs into the lungs every 6 (six) hours as needed for wheezing.     Azelastine HCl 0.15 % Soln  1-2 sprays each nostril twice daily as needed for nasal congestion     beclomethasone 80 MCG/ACT inhaler  Commonly known as:  QVAR  Inhale 2 puffs into the lungs 2 (two) times daily.     budesonide-formoterol 160-4.5 MCG/ACT inhaler  Commonly known as:  SYMBICORT  Inhale 2 puffs into the lungs 2 (two) times daily.     cetirizine 10 MG tablet  Commonly known as:  ZYRTEC  Take 10 mg by mouth daily.      fluticasone 50 MCG/ACT nasal spray  Commonly known as:  FLONASE  Place 2 sprays into both nostrils daily.     Melatonin 1 MG Tabs  Take by mouth as needed.     montelukast 10 MG tablet  Commonly known as:  SINGULAIR  Take 1 tablet (10 mg total) by mouth at bedtime.     omeprazole 20 MG tablet  Commonly known as:  PRILOSEC OTC  Take 1 tablet (20 mg total) by mouth daily.     topiramate 100 MG tablet  Commonly known as:  TOPAMAX  Take 100 mg by mouth 2 (two) times daily. Reported on 11/01/2015        Known medication allergies: No Known Allergies  I appreciate the opportunity to take part in this Michelle's care. Please do not hesitate to contact me with questions.  Sincerely,   R. Jorene Guest, MD

## 2015-11-01 NOTE — Assessment & Plan Note (Signed)
The most common causes of chronic cough include the following: upper airway cough syndrome (UACS) which is caused by variety of rhinosinus conditions; asthma; gastroesophageal reflux disease (GERD); chronic bronchitis from cigarette smoking or other inhaled environmental irritants; non-asthmatic eosinophilic bronchitis; and bronchiectasis. In prospective studies, these conditions have accounted for up to 94% of the causes of chronic cough in immunocompetent adults. The history and physical examination suggest that her cough is multifactorial with contribution from postnasal drainage, bronchial hyperresponsiveness, and acid reflux. We will address these issues at this time.   Treatment plan as outlined above.  We will regroup in 6 weeks to assess treatment response and adjust therapy accordingly.

## 2015-11-01 NOTE — Assessment & Plan Note (Signed)
   Appropriate reflux lifestyle modifications have been provided and discussed.  A prescription has been provided for omeprazole 20 mg daily 30 minutes prior to breakfast.

## 2015-11-02 ENCOUNTER — Telehealth: Payer: Self-pay | Admitting: Allergy and Immunology

## 2015-11-15 NOTE — Addendum Note (Signed)
Addended by: Candis SchatzBOBBITT, Ayyub Krall C on: 11/15/2015 03:14 PM   Modules accepted: Orders

## 2015-11-16 DIAGNOSIS — J3089 Other allergic rhinitis: Secondary | ICD-10-CM | POA: Diagnosis not present

## 2015-11-17 DIAGNOSIS — J301 Allergic rhinitis due to pollen: Secondary | ICD-10-CM | POA: Diagnosis not present

## 2015-11-21 ENCOUNTER — Ambulatory Visit (INDEPENDENT_AMBULATORY_CARE_PROVIDER_SITE_OTHER): Payer: BLUE CROSS/BLUE SHIELD

## 2015-11-21 DIAGNOSIS — J309 Allergic rhinitis, unspecified: Secondary | ICD-10-CM

## 2015-11-28 ENCOUNTER — Ambulatory Visit (INDEPENDENT_AMBULATORY_CARE_PROVIDER_SITE_OTHER): Payer: BLUE CROSS/BLUE SHIELD | Admitting: *Deleted

## 2015-11-28 DIAGNOSIS — J309 Allergic rhinitis, unspecified: Secondary | ICD-10-CM | POA: Diagnosis not present

## 2015-12-05 ENCOUNTER — Ambulatory Visit (INDEPENDENT_AMBULATORY_CARE_PROVIDER_SITE_OTHER): Payer: BLUE CROSS/BLUE SHIELD | Admitting: *Deleted

## 2015-12-05 DIAGNOSIS — J309 Allergic rhinitis, unspecified: Secondary | ICD-10-CM

## 2015-12-12 ENCOUNTER — Ambulatory Visit (INDEPENDENT_AMBULATORY_CARE_PROVIDER_SITE_OTHER): Payer: BLUE CROSS/BLUE SHIELD | Admitting: *Deleted

## 2015-12-12 DIAGNOSIS — J309 Allergic rhinitis, unspecified: Secondary | ICD-10-CM | POA: Diagnosis not present

## 2015-12-19 ENCOUNTER — Encounter: Payer: Self-pay | Admitting: Allergy and Immunology

## 2015-12-19 ENCOUNTER — Ambulatory Visit (INDEPENDENT_AMBULATORY_CARE_PROVIDER_SITE_OTHER): Payer: BLUE CROSS/BLUE SHIELD | Admitting: Allergy and Immunology

## 2015-12-19 VITALS — BP 122/74 | HR 85 | Temp 98.4°F | Resp 16

## 2015-12-19 DIAGNOSIS — K219 Gastro-esophageal reflux disease without esophagitis: Secondary | ICD-10-CM

## 2015-12-19 DIAGNOSIS — J3089 Other allergic rhinitis: Secondary | ICD-10-CM

## 2015-12-19 DIAGNOSIS — J4540 Moderate persistent asthma, uncomplicated: Secondary | ICD-10-CM | POA: Diagnosis not present

## 2015-12-19 MED ORDER — BECLOMETHASONE DIPROPIONATE 80 MCG/ACT IN AERS: 2.0000 | INHALATION_SPRAY | Freq: Two times a day (BID) | RESPIRATORY_TRACT | Status: DC

## 2015-12-19 MED ORDER — RANITIDINE HCL 150 MG/10ML PO SYRP: 150.0000 mg | ORAL_SOLUTION | Freq: Two times a day (BID) | ORAL | Status: DC

## 2015-12-19 NOTE — Progress Notes (Signed)
Follow-up Note  RE: Nicole Barber MRN: [id number] DOB: 19-Jan-1971 Date of Office Visit: 12/19/2015  Primary care provider: Lilyan Punt, MD Referring provider: Babs Sciara, MD  History of present illness: HPI Comments: Nicole Barber is a 45 y.o. female with persistent asthma, allergic rhinitis on immunotherapy, and gastroesophageal reflux disease presenting today for follow up.  She was seen for her initial evaluation in this clinic on 11/01/2015.  She reports significant upper and lower respiratory symptom reduction in the interval since her previous visit.  She reports that she cannot take Symbicort at bedtime because it makes her jittery and causes insomnia.  Therefore, she takes Symbicort, 2 inhalations via spacer device, in the morning only.  She also reports that she experiences nausea when taking omeprazole.  She believes that her symptoms have improved while on aeroallergen immunotherapy.  However, she has experienced a few large local reactions during build up injections from the pollen allergen containing vial.   Assessment and plan: Moderate persistent asthma Due to perceived side effects we will discontinue Symbicort at this time.  A sample and prescription have been provided for Qvar 80 g, 2 inhalations via spacer device twice a day.  Continue montelukast 10 mg daily bedtime and albuterol every 4-6 hours as needed.  Subjective and objective measures of pulmonary function will be followed and the treatment plan will be adjusted accordingly.  Perennial and seasonal allergic rhinitis  Continue aeroallergen immunotherapy as directed and as tolerated.  Given the recent history of large local reactions, her immunotherapy dose will be adjusted appropriately  Continue appropriate allergen avoidance measures, montelukast daily, fluticasone nasal spray as needed, azelastine nasal spray as needed, and/or cetirizine as needed.  We will plan to decrease or  discontinue medications as symptom relief from immunotherapy becomes evident.  GERD (gastroesophageal reflux disease) Due to perceived side effects, discontinue omeprazole.  Continue appropriate reflux lifestyle modifications.  A prescription has been provided for ranitidine 150 mg twice a day.    Meds ordered this encounter  Medications  . beclomethasone (QVAR) 80 MCG/ACT inhaler    Sig: Inhale 2 puffs into the lungs 2 (two) times daily.    Dispense:  1 Inhaler    Refill:  5  . ranitidine (ZANTAC) 150 MG/10ML syrup    Sig: Take 10 mLs (150 mg total) by mouth 2 (two) times daily.    Dispense:  300 mL    Refill:  0    Diagnositics: Spirometry:  Normal with an FEV1 of 95% predicted.  Please see scanned spirometry results for details.    Physical examination: Blood pressure 122/74, pulse 85, temperature 98.4 F (36.9 C), temperature source Oral, resp. rate 16, SpO2 94 %.  General: Alert, interactive, in no acute distress. HEENT: TMs pearly gray, turbinates mildly edematous without discharge, post-pharynx mildly erythematous. Neck: Supple without lymphadenopathy. Lungs: Clear to auscultation without wheezing, rhonchi or rales. CV: Normal S1, S2 without murmurs. Skin: Warm and dry, without lesions or rashes.  The following portions of the patient's history were reviewed and updated as appropriate: allergies, current medications, past family history, past medical history, past social history, past surgical history and problem list.    Medication List       This list is accurate as of: 12/19/15  7:08 PM.  Always use your most recent med list.               albuterol 108 (90 Base) MCG/ACT inhaler  Commonly known as:  PROVENTIL HFA;VENTOLIN  HFA  Inhale 2 puffs into the lungs every 6 (six) hours as needed for wheezing.     Azelastine HCl 0.15 % Soln  1-2 sprays each nostril twice daily as needed for nasal congestion     beclomethasone 80 MCG/ACT inhaler  Commonly known  as:  QVAR  Inhale 2 puffs into the lungs 2 (two) times daily.     budesonide-formoterol 160-4.5 MCG/ACT inhaler  Commonly known as:  SYMBICORT  Inhale 2 puffs into the lungs 2 (two) times daily.     cetirizine 10 MG tablet  Commonly known as:  ZYRTEC  Take 10 mg by mouth daily.     fluticasone 50 MCG/ACT nasal spray  Commonly known as:  FLONASE  Place 2 sprays into both nostrils daily.     Melatonin 1 MG Tabs  Take by mouth as needed.     montelukast 10 MG tablet  Commonly known as:  SINGULAIR  Take 1 tablet (10 mg total) by mouth at bedtime.     NON FORMULARY  Allergy Immunotherapy     omeprazole 20 MG tablet  Commonly known as:  PRILOSEC OTC  Take 1 tablet (20 mg total) by mouth daily.     ranitidine 150 MG/10ML syrup  Commonly known as:  ZANTAC  Take 10 mLs (150 mg total) by mouth 2 (two) times daily.     topiramate 100 MG tablet  Commonly known as:  TOPAMAX  Take 100 mg by mouth 2 (two) times daily. Reported on 11/01/2015        No Known Allergies  Review of systems: Constitutional: Negative for fever, chills and weight loss.  HENT: Negative for nosebleeds.   Eyes: Negative for blurred vision.  Respiratory: Negative for hemoptysis.   Cardiovascular: Negative for chest pain.  Gastrointestinal: Negative for diarrhea and constipation.  Genitourinary: Negative for dysuria.  Musculoskeletal: Negative for myalgias and joint pain.  Neurological: Negative for dizziness.  Endo/Heme/Allergies: Does not bruise/bleed easily.  Cutaneous: Negative for rash.  No past medical history on file.  Family History  Problem Relation Age of Onset  . Asthma Mother   . Diabetes Father   . Heart disease Father   . Allergic rhinitis Brother     Social History   Social History  . Marital Status: Married    Spouse Name: N/A  . Number of Children: N/A  . Years of Education: N/A   Occupational History  . Not on file.   Social History Main Topics  . Smoking status: Never  Smoker   . Smokeless tobacco: Not on file  . Alcohol Use: No  . Drug Use: No  . Sexual Activity: Not on file   Other Topics Concern  . Not on file   Social History Narrative    I appreciate the opportunity to take part in Michelle's care. Please do not hesitate to contact me with questions.  Sincerely,   R. Jorene Guestarter Malia Corsi, MD

## 2015-12-19 NOTE — Assessment & Plan Note (Signed)
Due to perceived side effects we will discontinue Symbicort at this time.  A sample and prescription have been provided for Qvar 80 g, 2 inhalations via spacer device twice a day.  Continue montelukast 10 mg daily bedtime and albuterol every 4-6 hours as needed.  Subjective and objective measures of pulmonary function will be followed and the treatment plan will be adjusted accordingly.

## 2015-12-19 NOTE — Patient Instructions (Addendum)
Moderate persistent asthma Due to perceived side effects we will discontinue Symbicort at this time.  A sample and prescription have been provided for Qvar 80 g, 2 inhalations via spacer device twice a day.  Continue montelukast 10 mg daily bedtime and albuterol every 4-6 hours as needed.  Subjective and objective measures of pulmonary function will be followed and the treatment plan will be adjusted accordingly.  Perennial and seasonal allergic rhinitis  Continue aeroallergen immunotherapy as directed and as tolerated.  Given the recent history of large local reactions, her immunotherapy dose will be adjusted appropriately  Continue appropriate allergen avoidance measures, montelukast daily, fluticasone nasal spray as needed, azelastine nasal spray as needed, and/or cetirizine as needed.  We will plan to decrease or discontinue medications as symptom relief from immunotherapy becomes evident.  GERD (gastroesophageal reflux disease) Due to perceived side effects, discontinue omeprazole.  Continue appropriate reflux lifestyle modifications.  A prescription has been provided for ranitidine 150 mg twice a day.    Return in about 4 months (around 04/19/2016), or if symptoms worsen or fail to improve.

## 2015-12-19 NOTE — Assessment & Plan Note (Addendum)
   Continue aeroallergen immunotherapy as directed and as tolerated.  Given the recent history of large local reactions, her immunotherapy dose will be adjusted appropriately  Continue appropriate allergen avoidance measures, montelukast daily, fluticasone nasal spray as needed, azelastine nasal spray as needed, and/or cetirizine as needed.  We will plan to decrease or discontinue medications as symptom relief from immunotherapy becomes evident.

## 2015-12-19 NOTE — Assessment & Plan Note (Signed)
Due to perceived side effects, discontinue omeprazole.  Continue appropriate reflux lifestyle modifications.  A prescription has been provided for ranitidine 150 mg twice a day.

## 2015-12-26 ENCOUNTER — Ambulatory Visit (INDEPENDENT_AMBULATORY_CARE_PROVIDER_SITE_OTHER): Payer: BLUE CROSS/BLUE SHIELD

## 2015-12-26 DIAGNOSIS — J309 Allergic rhinitis, unspecified: Secondary | ICD-10-CM

## 2016-01-04 ENCOUNTER — Ambulatory Visit (INDEPENDENT_AMBULATORY_CARE_PROVIDER_SITE_OTHER): Payer: BLUE CROSS/BLUE SHIELD

## 2016-01-04 DIAGNOSIS — J309 Allergic rhinitis, unspecified: Secondary | ICD-10-CM

## 2016-01-11 ENCOUNTER — Ambulatory Visit (INDEPENDENT_AMBULATORY_CARE_PROVIDER_SITE_OTHER): Payer: BLUE CROSS/BLUE SHIELD

## 2016-01-11 DIAGNOSIS — J309 Allergic rhinitis, unspecified: Secondary | ICD-10-CM

## 2016-01-16 ENCOUNTER — Ambulatory Visit (INDEPENDENT_AMBULATORY_CARE_PROVIDER_SITE_OTHER): Payer: BLUE CROSS/BLUE SHIELD | Admitting: *Deleted

## 2016-01-16 DIAGNOSIS — J309 Allergic rhinitis, unspecified: Secondary | ICD-10-CM | POA: Diagnosis not present

## 2016-01-23 ENCOUNTER — Ambulatory Visit (INDEPENDENT_AMBULATORY_CARE_PROVIDER_SITE_OTHER): Payer: BLUE CROSS/BLUE SHIELD | Admitting: *Deleted

## 2016-01-23 DIAGNOSIS — J309 Allergic rhinitis, unspecified: Secondary | ICD-10-CM

## 2016-02-01 ENCOUNTER — Ambulatory Visit (INDEPENDENT_AMBULATORY_CARE_PROVIDER_SITE_OTHER): Payer: BLUE CROSS/BLUE SHIELD | Admitting: *Deleted

## 2016-02-01 DIAGNOSIS — J309 Allergic rhinitis, unspecified: Secondary | ICD-10-CM

## 2016-02-08 ENCOUNTER — Ambulatory Visit (INDEPENDENT_AMBULATORY_CARE_PROVIDER_SITE_OTHER): Payer: BLUE CROSS/BLUE SHIELD

## 2016-02-08 DIAGNOSIS — J309 Allergic rhinitis, unspecified: Secondary | ICD-10-CM

## 2016-02-15 ENCOUNTER — Ambulatory Visit (INDEPENDENT_AMBULATORY_CARE_PROVIDER_SITE_OTHER): Payer: BLUE CROSS/BLUE SHIELD

## 2016-02-15 DIAGNOSIS — J309 Allergic rhinitis, unspecified: Secondary | ICD-10-CM

## 2016-02-22 ENCOUNTER — Ambulatory Visit (INDEPENDENT_AMBULATORY_CARE_PROVIDER_SITE_OTHER): Payer: BLUE CROSS/BLUE SHIELD

## 2016-02-22 DIAGNOSIS — J309 Allergic rhinitis, unspecified: Secondary | ICD-10-CM

## 2016-02-29 ENCOUNTER — Ambulatory Visit (INDEPENDENT_AMBULATORY_CARE_PROVIDER_SITE_OTHER): Payer: BLUE CROSS/BLUE SHIELD | Admitting: *Deleted

## 2016-02-29 DIAGNOSIS — J309 Allergic rhinitis, unspecified: Secondary | ICD-10-CM

## 2016-03-07 ENCOUNTER — Encounter: Payer: Self-pay | Admitting: Allergy

## 2016-03-07 ENCOUNTER — Encounter (INDEPENDENT_AMBULATORY_CARE_PROVIDER_SITE_OTHER): Payer: Self-pay

## 2016-03-07 ENCOUNTER — Ambulatory Visit (INDEPENDENT_AMBULATORY_CARE_PROVIDER_SITE_OTHER): Payer: BLUE CROSS/BLUE SHIELD | Admitting: Allergy

## 2016-03-07 VITALS — BP 110/85 | HR 80 | Temp 98.2°F | Resp 18

## 2016-03-07 DIAGNOSIS — J0100 Acute maxillary sinusitis, unspecified: Secondary | ICD-10-CM | POA: Diagnosis not present

## 2016-03-07 DIAGNOSIS — J4541 Moderate persistent asthma with (acute) exacerbation: Secondary | ICD-10-CM | POA: Diagnosis not present

## 2016-03-07 MED ORDER — ALBUTEROL SULFATE 108 (90 BASE) MCG/ACT IN AEPB: 2.0000 | INHALATION_SPRAY | Freq: Four times a day (QID) | RESPIRATORY_TRACT | 2 refills | Status: DC | PRN

## 2016-03-07 MED ORDER — AMOXICILLIN-POT CLAVULANATE 875-125 MG PO TABS: 1.0000 | ORAL_TABLET | Freq: Two times a day (BID) | ORAL | 0 refills | Status: DC

## 2016-03-07 NOTE — Progress Notes (Signed)
Follow-up Note  RE: Nicole Barber MRN: [id number] DOB: [birth year] Date of Office Visit: 03/07/2016   History of present illness: Nicole Barber is a 45 y.o. female presenting today for sick visit. She was last seen by Dr. Nunzio CobbsBobbitt in June 2017 for asthma, allergic rhinitis and reflux.   She presents today as she has been having a week of worsening sinus symptoms. She is on allergen immunotherapy reports after her last injection she started having symptoms of increased  nasal congestion and drainage, sore throat, cough and ears are full and sore.  Also having a lot of sinus pressure and pain and watery eyes.  Her symptoms continue to worsen every day she states. She denies having any fevers or any sick contacts. 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 She has been taking her regular medicines including zyrtec and singulair daily. She also started taking Benadryl.  For her asthma control she uses Qvar 2 puffs twice day.  She reports she is having worsening cough symptoms at night.  She is currently using albuterol at least 3 times a day which she does get relief.  She also is constantly sucking on cough drops to help from coughing.    Review of systems: Review of Systems  Constitutional: Positive for malaise/fatigue. Negative for chills and fever.  HENT: Positive for congestion, ear pain and sore throat.   Eyes: Positive for discharge. Negative for redness.  Respiratory: Positive for cough and shortness of breath. Negative for wheezing.   Cardiovascular: Negative for chest pain.  Gastrointestinal: Negative for nausea and vomiting.  Skin: Negative for rash.  Neurological: Positive for headaches.    All other systems negative unless noted above in HPI  Past medical/social/surgical/family history have been reviewed and are unchanged unless specifically indicated below.  No changes  Medication List:   Medication List       Accurate as of 03/07/16 11:28 AM. Always use your most  recent med list.          albuterol 108 (90 Base) MCG/ACT inhaler Commonly known as:  PROVENTIL HFA;VENTOLIN HFA Inhale 2 puffs into the lungs every 6 (six) hours as needed for wheezing.   beclomethasone 80 MCG/ACT inhaler Commonly known as:  QVAR Inhale 2 puffs into the lungs 2 (two) times daily.   cetirizine 10 MG tablet Commonly known as:  ZYRTEC Take 10 mg by mouth daily.   Melatonin 1 MG Tabs Take by mouth as needed.   montelukast 10 MG tablet Commonly known as:  SINGULAIR Take 1 tablet (10 mg total) by mouth at bedtime.   NON FORMULARY Allergy Immunotherapy   ranitidine 150 MG/10ML syrup Commonly known as:  ZANTAC Take 10 mLs (150 mg total) by mouth 2 (two) times daily.   topiramate 100 MG tablet Commonly known as:  TOPAMAX Take 100 mg by mouth 2 (two) times daily. Reported on 11/01/2015       Known medication allergies: No Known Allergies   Physical examination: Blood pressure 110/85, pulse 80, temperature 98.2 F (36.8 C), temperature source Oral, resp. rate 18.  General: Alert, interactive, in no acute distress. HEENT: TMs pearly gray, turbinates markedly edematous and pale with thick discharge, post-pharynx moderately erythematous. No exudates Neck: Supple without lymphadenopathy. Lungs: Clear to auscultation without wheezing, rhonchi or rales. {no increased work of breathing. CV: Normal S1, S2 without murmurs. Abdomen: Nondistended, nontender. Skin: Warm and dry, without lesions or rashes. Extremities:  No clubbing, cyanosis or edema. Neuro:   Grossly intact.  Diagnositics/Labs:  Spirometry: FEV1: 1.77L  80%, FVC: 2.00L  77%, DuoNeb was provided and she did have any improvements after. Her lung functions are decreased from previous study.  Assessment and plan:   Sinusitis with Asthma exacerbation   - will treat for sinus infection with worsening sinus pressure and significant nasal congestion/drainage now for past week      - start Augmentin  875mg  twice a day for 10 days  - continue zyrtec and singulair daily             - advise use of nasal saline rinse daily             - if you can tolerate would recommend use of Flonase 2 spray daily           - asthma exacerbation            - continue Qvar 80 2 puffs twice a day            - continue singulair at bedtime            - use albuterol while sick every 4 hours (during day) for next several days then use every 6  hours x 2 days, then every 8 hours x 2 days then resume as needed use  - take prednisone 40mg  x 3 days        Asthma control goals:   Full participation in all desired activities (may need albuterol before activity)  Albuterol use two time or less a week on average (not counting use with activity)  Cough interfering with sleep two time or less a month  Oral steroids no more than once a year  No hospitalizations  -Please hold your allergy shots while you are acutely sick   Follow-up 3-4 weeks  I appreciate the opportunity to take part in Alleyne's care. Please do not hesitate to contact me with questions.  Sincerely,   Margo Aye, MD Allergy/Immunology [facility]

## 2016-03-07 NOTE — Patient Instructions (Addendum)
Sinusitis with Asthma exacerbation   - will treat for sinus infection with worsening sinus pressure and significant nasal congestion/drainge now for past week      - start Augmentin 875mg  twice a day for 10 days  - continue zyrtec and singulair daily             - advise use of nasal saline rinse daily             - if you can tolerate would recommend use of Flonase 2                spray daily           - asthma exacerbation            - continue Qvar 80 2 puffs twice a day            - continue singulair at bedtime            - use albuterol while sick every 4 hours (during day) for next  several days then use every 6 hours x 2 days, then every 8  hours x 2 days then resume as needed use  - take prednisone 40mg  x 3 days        Asthma control goals:   Full participation in all desired activities (may need albuterol before activity)  Albuterol use two time or less a week on average (not counting use with activity)  Cough interfering with sleep two time or less a month  Oral steroids no more than once a year  No hospitalizations  -Please hold your allergy shots while you are acutely sick   Follow-up 3-4 weeks as as he is as pertinent to few he was treated for his laceration. There is an prednisone and

## 2016-03-14 ENCOUNTER — Ambulatory Visit (INDEPENDENT_AMBULATORY_CARE_PROVIDER_SITE_OTHER): Payer: BLUE CROSS/BLUE SHIELD | Admitting: *Deleted

## 2016-03-14 DIAGNOSIS — J309 Allergic rhinitis, unspecified: Secondary | ICD-10-CM | POA: Diagnosis not present

## 2016-03-21 ENCOUNTER — Ambulatory Visit (INDEPENDENT_AMBULATORY_CARE_PROVIDER_SITE_OTHER): Payer: BLUE CROSS/BLUE SHIELD

## 2016-03-21 DIAGNOSIS — J309 Allergic rhinitis, unspecified: Secondary | ICD-10-CM | POA: Diagnosis not present

## 2016-04-04 ENCOUNTER — Ambulatory Visit (INDEPENDENT_AMBULATORY_CARE_PROVIDER_SITE_OTHER): Payer: BLUE CROSS/BLUE SHIELD | Admitting: *Deleted

## 2016-04-04 DIAGNOSIS — J3089 Other allergic rhinitis: Secondary | ICD-10-CM | POA: Diagnosis not present

## 2016-04-18 ENCOUNTER — Ambulatory Visit (INDEPENDENT_AMBULATORY_CARE_PROVIDER_SITE_OTHER): Payer: BLUE CROSS/BLUE SHIELD | Admitting: *Deleted

## 2016-04-18 DIAGNOSIS — J3089 Other allergic rhinitis: Secondary | ICD-10-CM

## 2016-04-23 ENCOUNTER — Ambulatory Visit: Payer: BLUE CROSS/BLUE SHIELD | Admitting: Allergy and Immunology

## 2016-05-07 ENCOUNTER — Ambulatory Visit: Payer: BLUE CROSS/BLUE SHIELD | Admitting: Allergy and Immunology

## 2016-07-03 DIAGNOSIS — J209 Acute bronchitis, unspecified: Secondary | ICD-10-CM | POA: Diagnosis not present

## 2016-07-03 DIAGNOSIS — J019 Acute sinusitis, unspecified: Secondary | ICD-10-CM | POA: Diagnosis not present

## 2016-07-15 DIAGNOSIS — J180 Bronchopneumonia, unspecified organism: Secondary | ICD-10-CM | POA: Diagnosis not present

## 2016-08-08 ENCOUNTER — Encounter: Payer: Self-pay | Admitting: Family Medicine

## 2016-08-08 ENCOUNTER — Ambulatory Visit (INDEPENDENT_AMBULATORY_CARE_PROVIDER_SITE_OTHER): Payer: BLUE CROSS/BLUE SHIELD | Admitting: Family Medicine

## 2016-08-08 VITALS — BP 130/80 | Temp 98.4°F | Ht 61.0 in | Wt 194.2 lb

## 2016-08-08 DIAGNOSIS — J4521 Mild intermittent asthma with (acute) exacerbation: Secondary | ICD-10-CM | POA: Diagnosis not present

## 2016-08-08 DIAGNOSIS — J019 Acute sinusitis, unspecified: Secondary | ICD-10-CM | POA: Diagnosis not present

## 2016-08-08 MED ORDER — LEVOFLOXACIN 500 MG PO TABS: 500.0000 mg | ORAL_TABLET | Freq: Every day | ORAL | 0 refills | Status: DC

## 2016-08-08 MED ORDER — PREDNISONE 20 MG PO TABS: ORAL_TABLET | ORAL | 0 refills | Status: DC

## 2016-08-08 NOTE — Progress Notes (Signed)
   Subjective:    Patient ID: Nicole Barber, female    DOB: May 24, 1971, 46 y.o.   MRN: [id number]  URI   This is a new problem. The current episode started more than 1 month ago. The problem has been unchanged. There has been no fever. Associated symptoms include congestion, coughing, ear pain, headaches, a sore throat and wheezing. Treatments tried: antibiotic, prednisone. The treatment provided no relief.  Significant coughing congestion not feeling good this is been going on ever since Christmas Eve started off first with the Z-Pak did not give much better with this then was treated with Augmentin and prednisone and that did not help a lot either patient has a history of asthma related issues. She denies high fever chills denies body ache no flulike symptoms.  Review of Systems  HENT: Positive for congestion, ear pain and sore throat.   Respiratory: Positive for cough and wheezing.   Neurological: Positive for headaches.       Objective:   Physical Exam She does not appear rest or distress but she has a frequent cough HEENT is benign mild sinus congestion noted no crackles rails or rhonchi       Assessment & Plan:  Asthmatic bronchitis Secondary rhinosinusitis Levaquin 10 days Prednisone taper over the next 7 days, if progressive troubles or if worse follow-up immediately Restart Qvar Albuterol frequently If ongoing troubles x-rays would be indicated warning signs regarding the flu were discussed in detail

## 2017-06-03 ENCOUNTER — Ambulatory Visit (INDEPENDENT_AMBULATORY_CARE_PROVIDER_SITE_OTHER): Payer: BLUE CROSS/BLUE SHIELD | Admitting: Family Medicine

## 2017-06-03 ENCOUNTER — Encounter: Payer: Self-pay | Admitting: Family Medicine

## 2017-06-03 VITALS — BP 130/84 | Temp 98.7°F | Ht 61.0 in | Wt 197.0 lb

## 2017-06-03 DIAGNOSIS — J45901 Unspecified asthma with (acute) exacerbation: Secondary | ICD-10-CM | POA: Diagnosis not present

## 2017-06-03 DIAGNOSIS — J4540 Moderate persistent asthma, uncomplicated: Secondary | ICD-10-CM | POA: Diagnosis not present

## 2017-06-03 DIAGNOSIS — J3089 Other allergic rhinitis: Secondary | ICD-10-CM

## 2017-06-03 MED ORDER — PREDNISONE 20 MG PO TABS: ORAL_TABLET | ORAL | 0 refills | Status: DC

## 2017-06-03 MED ORDER — ALBUTEROL SULFATE HFA 108 (90 BASE) MCG/ACT IN AERS: 2.0000 | INHALATION_SPRAY | Freq: Four times a day (QID) | RESPIRATORY_TRACT | 2 refills | Status: DC | PRN

## 2017-06-03 MED ORDER — FLUTICASONE-SALMETEROL 250-50 MCG/DOSE IN AEPB: 1.0000 | INHALATION_SPRAY | Freq: Two times a day (BID) | RESPIRATORY_TRACT | 3 refills | Status: DC

## 2017-06-03 MED ORDER — MONTELUKAST SODIUM 10 MG PO TABS: 10.0000 mg | ORAL_TABLET | Freq: Every day | ORAL | 5 refills | Status: DC

## 2017-06-03 NOTE — Progress Notes (Signed)
   Subjective:    Patient ID: Greer Pickerelngela M Vaness, female    DOB: 1970/07/23, 46 y.o.   MRN: [id number]  Sinusitis  This is a new problem. Episode onset: 3 weeks. Associated symptoms include congestion, coughing, headaches and a sore throat. (Sore throat, headache, ear pain, wheezing, abdominal pain, diarrhea) Treatments tried: tylenol sinus, alka selzter, nyquil.   Frequent coughing spells congestion chest congestion does not bring up any discolored phlegm denies high fever chills sweats denies passing out does relate some intermittent wheezing PMH benign   Review of Systems  HENT: Positive for congestion and sore throat.   Respiratory: Positive for cough.   Neurological: Positive for headaches.       Objective:   Physical Exam Neck no masses eardrums are normal lungs are clear but chest cough is noted heart regular       Assessment & Plan:  Asthma exacerbation Switch to Advair Singulair daily May switch back to Qvar once more stable in the spring Albuterol as a rescue inhaler No sign of bacterial infection do not recommend antibiotics If fever or discolored phlegm to let us know Warning signs were discussed in detail

## 2017-06-04 ENCOUNTER — Telehealth: Payer: Self-pay | Admitting: Family Medicine

## 2017-06-04 NOTE — Telephone Encounter (Signed)
Pt is needing a different inhaler called in other than the Advair. Pt's copay is too expensive and she is needing a cheaper option.    [facility] [place]

## 2017-06-04 NOTE — Telephone Encounter (Signed)
There are several other medicines that are dual agents for asthma including Symbicort.  The difficult part of this situation is that there is no way that we know how much the medicine will cost her only her insurance company will now.  She needs to call her insurance company and let them know that her doctor prescribed Advair but she needs to ask them do they have a dual medication on the formulary that is cheaper-when she finds that out she can let us know and we will prescribe it.  For now stick with the inhaler she has plus use the prednisone we prescribed once she finds this information out regarding preferred medication we will be happy to prescribe it

## 2017-06-04 NOTE — Telephone Encounter (Signed)
Pt is aware.  

## 2017-06-11 ENCOUNTER — Telehealth: Payer: Self-pay | Admitting: Family Medicine

## 2017-06-11 ENCOUNTER — Other Ambulatory Visit: Payer: Self-pay | Admitting: *Deleted

## 2017-06-11 MED ORDER — HYDROCODONE-HOMATROPINE 5-1.5 MG/5ML PO SYRP: ORAL_SOLUTION | ORAL | 0 refills | Status: DC

## 2017-06-11 NOTE — Telephone Encounter (Signed)
Discussed with pt. Pt verbalized understanding. Med ready for pickup.

## 2017-06-11 NOTE — Telephone Encounter (Signed)
Patient states still has real bad cough and its worst this week than last week.   She is finishing up her prednisone 20 mg today and using her inhaler but not helping. She states still congested,cant sleep ,bad cough.[facility]

## 2017-06-11 NOTE — Telephone Encounter (Signed)
Hycodan cough medication 1 teaspoon nightly as needed cough, 90 cc, patient should do a follow-up on her asthma somewhere in the next several weeks-in severe cases that do not respond to usual measures sometimes have to get a asthma doctor involved

## 2017-06-11 NOTE — Telephone Encounter (Signed)
Seem 12/4. Using advair, albuterol, singulair, zyrtec. No trouble breathing but has to sleep propped up. Constant cough, barking deep cough, head and nose more congested. Nothing coming up. No fever. Has had tessalon pearls for cough in the past that helped but also thinks the cough syrup would help. Pt would like what ever you recommend. States it makes her sleepy.  Taking nyquil at night but not helping the cough.  [facility] [place]

## 2017-06-13 ENCOUNTER — Telehealth: Payer: Self-pay | Admitting: Family Medicine

## 2017-06-13 DIAGNOSIS — R05 Cough: Secondary | ICD-10-CM

## 2017-06-13 DIAGNOSIS — R059 Cough, unspecified: Secondary | ICD-10-CM

## 2017-06-13 NOTE — Telephone Encounter (Signed)
Chest xray that covers back too, nurses plz get a bit more description of pain plz

## 2017-06-13 NOTE — Telephone Encounter (Signed)
Patient is aware 

## 2017-06-13 NOTE — Telephone Encounter (Signed)
Ok do ch xray

## 2017-06-13 NOTE — Telephone Encounter (Signed)
Pt is wanting to know if a chest/back xray can be ordered. Pt states that she is hurting in her chest and back when she coughs. Please advise.

## 2017-06-13 NOTE — Telephone Encounter (Signed)
Seen 12 4 18 

## 2017-06-13 NOTE — Telephone Encounter (Signed)
I called and left a vm asked that she r/c. 

## 2017-06-13 NOTE — Telephone Encounter (Signed)
I spoke with the pt and she states she has had a cough for 6 wks now, it has started becoming productive with in the last 24 hours. She says her chest is sore from all of the coughing. She is not running a fever. She is afraid she may have pneumonia.Please advise.

## 2017-06-14 ENCOUNTER — Ambulatory Visit (HOSPITAL_COMMUNITY)
Admission: RE | Admit: 2017-06-14 | Discharge: 2017-06-14 | Disposition: A | Discharge status: Home / Self Care | Payer: BLUE CROSS/BLUE SHIELD | Source: Ambulatory Visit | Attending: Family Medicine | Admitting: Family Medicine

## 2017-06-14 DIAGNOSIS — R059 Cough, unspecified: Secondary | ICD-10-CM

## 2017-06-14 DIAGNOSIS — R05 Cough: Secondary | ICD-10-CM | POA: Insufficient documentation

## 2017-06-17 ENCOUNTER — Encounter: Payer: Self-pay | Admitting: Family Medicine

## 2017-06-17 ENCOUNTER — Ambulatory Visit (INDEPENDENT_AMBULATORY_CARE_PROVIDER_SITE_OTHER): Payer: BLUE CROSS/BLUE SHIELD | Admitting: Family Medicine

## 2017-06-17 VITALS — BP 132/90 | Temp 98.3°F | Ht 61.0 in | Wt 195.1 lb

## 2017-06-17 DIAGNOSIS — J181 Lobar pneumonia, unspecified organism: Secondary | ICD-10-CM

## 2017-06-17 DIAGNOSIS — J189 Pneumonia, unspecified organism: Secondary | ICD-10-CM

## 2017-06-17 DIAGNOSIS — J4540 Moderate persistent asthma, uncomplicated: Secondary | ICD-10-CM | POA: Diagnosis not present

## 2017-06-17 DIAGNOSIS — J209 Acute bronchitis, unspecified: Secondary | ICD-10-CM | POA: Diagnosis not present

## 2017-06-17 DIAGNOSIS — J019 Acute sinusitis, unspecified: Secondary | ICD-10-CM

## 2017-06-17 MED ORDER — CEFTRIAXONE SODIUM 500 MG IJ SOLR
500.0000 mg | Freq: Once | INTRAMUSCULAR | Status: AC
  Administered 2017-06-17: 500 mg via INTRAMUSCULAR

## 2017-06-17 MED ORDER — AZITHROMYCIN 250 MG PO TABS: ORAL_TABLET | ORAL | 0 refills | Status: DC

## 2017-06-17 MED ORDER — AMOXICILLIN-POT CLAVULANATE 875-125 MG PO TABS: 1.0000 | ORAL_TABLET | Freq: Two times a day (BID) | ORAL | 0 refills | Status: DC

## 2017-06-17 NOTE — Progress Notes (Signed)
   Subjective:    Patient ID: Nicole Barber, female    DOB: [birth year], 46 y.o.   MRN: [id number]  HPI  Patient is here today with a non productive cough that she says she has had for eight weeks. Now having some sinus drainage.She just finished prednisone,inhaler albuterol/advair,singular,and zyrtec, Nyquil. Significant head congestion drainage coughing sinus pressure also relates chest congestion occasionally coughs up yellow phlegm recent chest x-ray normal denies fevers but does relate some sweats still having some moderate wheezing despite medication-She was seen for what she thought was asthma flareup Review of Systems  Constitutional: Negative for activity change and fever.  HENT: Positive for congestion and rhinorrhea. Negative for ear pain.   Eyes: Negative for discharge.  Respiratory: Positive for cough, shortness of breath and wheezing.   Cardiovascular: Negative for chest pain.       Objective:   Physical Exam  Constitutional: She appears well-developed.  HENT:  Head: Normocephalic.  Right Ear: External ear normal.  Left Ear: External ear normal.  Nose: Nose normal.  Mouth/Throat: Oropharynx is clear and moist. No oropharyngeal exudate.  Eyes: Right eye exhibits no discharge. Left eye exhibits no discharge.  Neck: Neck supple. No tracheal deviation present.  Cardiovascular: Normal rate and normal heart sounds.  No murmur heard. Pulmonary/Chest: Effort normal. She has no wheezes. She has no rales.  Crackles in the left base  Lymphadenopathy:    She has no cervical adenopathy.  Skin: Skin is warm and dry.  Nursing note and vitals reviewed.  Moderate sinus tenderness to percussion  No respiratory distress but frequent coughing noted     Assessment & Plan:  Viral syndrome Sinusitis Asthma exacerbation Early pneumonia not seen on x-ray Rocephin Dual antibiotic therapy Call back if progressive troubles Patient not toxic does not need to be  hospitalized Warning signs were discussed and

## 2017-07-07 ENCOUNTER — Telehealth: Payer: Self-pay | Admitting: Family Medicine

## 2017-07-07 ENCOUNTER — Other Ambulatory Visit: Payer: Self-pay | Admitting: *Deleted

## 2017-07-07 DIAGNOSIS — J45909 Unspecified asthma, uncomplicated: Secondary | ICD-10-CM

## 2017-07-07 MED ORDER — CEFDINIR 300 MG PO CAPS: 300.0000 mg | ORAL_CAPSULE | Freq: Two times a day (BID) | ORAL | 0 refills | Status: DC

## 2017-07-07 NOTE — Telephone Encounter (Signed)
Patient was seen 12/18 with bad cough and congestion.She stating still having same symptoms,cough and congestion especially at night cough is worst.[facility]anting [facility] see [facility] [place]

## 2017-07-07 NOTE — Telephone Encounter (Signed)
Discussed with pt. Pt verbalized understanding. Antibiotic sent to pharm. Referral put in.

## 2017-07-07 NOTE — Telephone Encounter (Signed)
Omnicef 300 mg 1 twice daily for 10 days, if the patient is having significant difficulties with breathing or other associated issues I will be happy to see them in for recheck, I also recommend a consultation with pulmonology-please go ahead with pulmonology referral

## 2017-07-07 NOTE — Telephone Encounter (Signed)
Patient given z pack, Augmentin and hycodan 06/17/17

## 2017-07-14 ENCOUNTER — Encounter: Payer: Self-pay | Admitting: Family Medicine

## 2017-08-06 ENCOUNTER — Encounter: Payer: Self-pay | Admitting: Family Medicine

## 2017-09-09 ENCOUNTER — Ambulatory Visit (INDEPENDENT_AMBULATORY_CARE_PROVIDER_SITE_OTHER): Payer: BLUE CROSS/BLUE SHIELD | Admitting: Family Medicine

## 2017-09-09 VITALS — BP 118/82 | Temp 99.0°F | Ht 61.0 in | Wt 196.0 lb

## 2017-09-09 DIAGNOSIS — J019 Acute sinusitis, unspecified: Secondary | ICD-10-CM | POA: Diagnosis not present

## 2017-09-09 DIAGNOSIS — B9689 Other specified bacterial agents as the cause of diseases classified elsewhere: Secondary | ICD-10-CM | POA: Diagnosis not present

## 2017-09-09 MED ORDER — AZITHROMYCIN 250 MG PO TABS: ORAL_TABLET | ORAL | 0 refills | Status: DC

## 2017-09-09 MED ORDER — HYDROCODONE-HOMATROPINE 5-1.5 MG/5ML PO SYRP: ORAL_SOLUTION | ORAL | 0 refills | Status: DC

## 2017-09-09 NOTE — Progress Notes (Signed)
   Subjective:    Patient ID: Nicole Barber, female    DOB: [birth year], 47 y.o.   MRN: [id number]  Cough  This is a new problem. The current episode started in the past 7 days. Associated symptoms include ear pain, headaches, nasal congestion, rhinorrhea and a sore throat. Pertinent negatives include no chest pain, fever, shortness of breath or wheezing. Treatments tried: dayquil and nyquil.  Patient with a history of cough variant asthma.  No other particular troubles.  Denies any severe wheezing or shortness of breath    Review of Systems  Constitutional: Positive for fatigue. Negative for activity change and fever.  HENT: Positive for congestion, ear pain, rhinorrhea and sore throat.   Eyes: Negative for discharge.  Respiratory: Positive for cough. Negative for shortness of breath and wheezing.   Cardiovascular: Negative for chest pain.  Neurological: Positive for headaches.       Objective:   Physical Exam  Constitutional: She appears well-developed.  HENT:  Head: Normocephalic.  Right Ear: External ear normal.  Left Ear: External ear normal.  Nose: Nose normal.  Mouth/Throat: Oropharynx is clear and moist. No oropharyngeal exudate.  Eyes: Right eye exhibits no discharge. Left eye exhibits no discharge.  Neck: Neck supple. No tracheal deviation present.  Cardiovascular: Normal rate and normal heart sounds.  No murmur heard. Pulmonary/Chest: Effort normal and breath sounds normal. She has no wheezes. She has no rales.  Lymphadenopathy:    She has no cervical adenopathy.  Skin: Skin is warm and dry.  Nursing note and vitals reviewed.         Assessment & Plan:  Persistent cough Probable sinusitis Antibiotics prescribed warnings discussed Use albuterol Use Advair for the next couple weeks Follow-up if progressive troubles

## 2017-09-15 ENCOUNTER — Telehealth: Payer: Self-pay | Admitting: Family Medicine

## 2017-09-15 ENCOUNTER — Other Ambulatory Visit: Payer: Self-pay | Admitting: *Deleted

## 2017-09-15 MED ORDER — AMOXICILLIN-POT CLAVULANATE 875-125 MG PO TABS: 1.0000 | ORAL_TABLET | Freq: Two times a day (BID) | ORAL | 0 refills | Status: DC

## 2017-09-15 NOTE — Telephone Encounter (Signed)
Pt called stating that she was seen last Monday and was diagnosed with a sinus inf and an uri. Pt states that she was told that if she was not better by today to call and Dr. Lorin PicketScott would call something else in.  Pt states that she has finished the zpac that she was prescribed and is not better. Please advise.  [facility] [place]

## 2017-09-15 NOTE — Telephone Encounter (Signed)
Patient seen last Monday and diagnosed with sinus infection- given a z pack and has finished the medication but still having symptoms and would like another medication sent in

## 2017-09-15 NOTE — Telephone Encounter (Signed)
Med sent to pharm. Pt notified.  

## 2017-09-15 NOTE — Telephone Encounter (Signed)
At this point I recommend Augmentin 875 mg 1 twice daily for 10 days if ongoing troubles please follow-up

## 2017-12-24 ENCOUNTER — Encounter: Payer: Self-pay | Admitting: Family Medicine

## 2018-03-19 DIAGNOSIS — Z23 Encounter for immunization: Secondary | ICD-10-CM | POA: Diagnosis not present

## 2018-04-27 ENCOUNTER — Encounter: Payer: BLUE CROSS/BLUE SHIELD | Admitting: Family Medicine

## 2018-06-15 DIAGNOSIS — H524 Presbyopia: Secondary | ICD-10-CM | POA: Diagnosis not present

## 2018-06-15 DIAGNOSIS — H5213 Myopia, bilateral: Secondary | ICD-10-CM | POA: Diagnosis not present

## 2018-08-28 ENCOUNTER — Other Ambulatory Visit: Payer: Self-pay | Admitting: Nurse Practitioner

## 2018-08-28 DIAGNOSIS — Z1231 Encounter for screening mammogram for malignant neoplasm of breast: Secondary | ICD-10-CM

## 2018-08-28 DIAGNOSIS — J302 Other seasonal allergic rhinitis: Secondary | ICD-10-CM | POA: Diagnosis not present

## 2018-08-28 DIAGNOSIS — N951 Menopausal and female climacteric states: Secondary | ICD-10-CM | POA: Diagnosis not present

## 2018-08-28 DIAGNOSIS — G43109 Migraine with aura, not intractable, without status migrainosus: Secondary | ICD-10-CM | POA: Diagnosis not present

## 2018-08-28 DIAGNOSIS — Z13220 Encounter for screening for lipoid disorders: Secondary | ICD-10-CM | POA: Diagnosis not present

## 2018-08-28 DIAGNOSIS — J4520 Mild intermittent asthma, uncomplicated: Secondary | ICD-10-CM | POA: Diagnosis not present

## 2018-08-28 DIAGNOSIS — Z91010 Allergy to peanuts: Secondary | ICD-10-CM | POA: Diagnosis not present

## 2018-08-28 DIAGNOSIS — Z823 Family history of stroke: Secondary | ICD-10-CM | POA: Diagnosis not present

## 2018-08-28 DIAGNOSIS — Z7689 Persons encountering health services in other specified circumstances: Secondary | ICD-10-CM | POA: Diagnosis not present

## 2018-08-28 DIAGNOSIS — Z8249 Family history of ischemic heart disease and other diseases of the circulatory system: Secondary | ICD-10-CM | POA: Diagnosis not present

## 2018-09-04 ENCOUNTER — Ambulatory Visit
Admission: RE | Admit: 2018-09-04 | Discharge: 2018-09-04 | Disposition: A | Discharge status: Home / Self Care | Payer: 59 | Source: Ambulatory Visit | Attending: Nurse Practitioner | Admitting: Nurse Practitioner

## 2018-09-04 DIAGNOSIS — Z1231 Encounter for screening mammogram for malignant neoplasm of breast: Secondary | ICD-10-CM

## 2018-09-08 ENCOUNTER — Other Ambulatory Visit: Payer: Self-pay | Admitting: Nurse Practitioner

## 2018-09-08 DIAGNOSIS — R928 Other abnormal and inconclusive findings on diagnostic imaging of breast: Secondary | ICD-10-CM

## 2018-09-11 ENCOUNTER — Ambulatory Visit
Admission: RE | Admit: 2018-09-11 | Discharge: 2018-09-11 | Disposition: A | Discharge status: Home / Self Care | Payer: 59 | Source: Ambulatory Visit | Attending: Nurse Practitioner | Admitting: Nurse Practitioner

## 2018-09-11 ENCOUNTER — Other Ambulatory Visit: Payer: Self-pay

## 2018-09-11 DIAGNOSIS — R928 Other abnormal and inconclusive findings on diagnostic imaging of breast: Secondary | ICD-10-CM

## 2018-09-11 DIAGNOSIS — N6011 Diffuse cystic mastopathy of right breast: Secondary | ICD-10-CM | POA: Diagnosis not present

## 2018-09-11 DIAGNOSIS — R922 Inconclusive mammogram: Secondary | ICD-10-CM | POA: Diagnosis not present

## 2019-02-11 ENCOUNTER — Other Ambulatory Visit: Payer: Self-pay | Admitting: Internal Medicine

## 2019-02-11 DIAGNOSIS — Z20822 Contact with and (suspected) exposure to covid-19: Secondary | ICD-10-CM

## 2019-02-13 LAB — NOVEL CORONAVIRUS, NAA: SARS-CoV-2, NAA: NOT DETECTED

## 2019-02-16 ENCOUNTER — Telehealth: Payer: Self-pay | Admitting: *Deleted

## 2019-02-16 NOTE — Telephone Encounter (Signed)
Patient returned call- notified of negative COVID results. Patient was tested as precaution to possible exposure. Patient does not have symptoms. Advised continued safe precautions and notify PCP for changes in status.

## 2019-04-09 DIAGNOSIS — Z23 Encounter for immunization: Secondary | ICD-10-CM | POA: Diagnosis not present

## 2019-06-30 ENCOUNTER — Ambulatory Visit: Payer: 59 | Attending: Internal Medicine

## 2019-06-30 ENCOUNTER — Other Ambulatory Visit: Payer: Self-pay

## 2019-06-30 DIAGNOSIS — Z20822 Contact with and (suspected) exposure to covid-19: Secondary | ICD-10-CM

## 2019-06-30 DIAGNOSIS — Z20828 Contact with and (suspected) exposure to other viral communicable diseases: Secondary | ICD-10-CM | POA: Diagnosis not present

## 2019-07-01 LAB — NOVEL CORONAVIRUS, NAA: SARS-CoV-2, NAA: NOT DETECTED

## 2019-07-26 ENCOUNTER — Other Ambulatory Visit: Payer: Self-pay

## 2019-07-26 ENCOUNTER — Ambulatory Visit: Payer: 59 | Attending: Internal Medicine

## 2019-07-26 DIAGNOSIS — Z20822 Contact with and (suspected) exposure to covid-19: Secondary | ICD-10-CM | POA: Diagnosis not present

## 2019-07-27 LAB — NOVEL CORONAVIRUS, NAA: SARS-CoV-2, NAA: NOT DETECTED

## 2019-09-10 ENCOUNTER — Ambulatory Visit: Payer: 59 | Attending: Internal Medicine

## 2019-09-10 DIAGNOSIS — Z23 Encounter for immunization: Secondary | ICD-10-CM

## 2019-09-10 NOTE — Progress Notes (Signed)
   Covid-19 Vaccination Clinic  Name:  Nicole Barber    MRN: [id number] DOB: 27-Jan-1971  09/10/2019  Nicole Barber was observed post Covid-19 immunization for 15 minutes without incident. She was provided with Vaccine Information Sheet and instruction to access the V-Safe system.   Nicole Barber was instructed to call 911 with any severe reactions post vaccine: Marland Kitchen Difficulty breathing  . Swelling of face and throat  . A fast heartbeat  . A bad rash all over body  . Dizziness and weakness   Immunizations Administered    Name Date Dose VIS Date Route   Moderna COVID-19 Vaccine 09/10/2019 11:16 AM 0.5 mL 06/01/2019 Intramuscular   Manufacturer: Moderna   Lot: [id number]   NDC: 11941-740-81

## 2019-10-13 ENCOUNTER — Ambulatory Visit: Payer: 59 | Attending: Internal Medicine

## 2019-10-13 DIAGNOSIS — Z23 Encounter for immunization: Secondary | ICD-10-CM

## 2019-10-13 NOTE — Progress Notes (Signed)
   Covid-19 Vaccination Clinic  Name:  Nicole Barber    MRN: [id number] DOB: Aug 07, 1970  10/13/2019  [name] was observed post Covid-19 immunization for 15 minutes without incident. She was provided with Vaccine Information Sheet and instruction to access the V-Safe system.   [name] was instructed to call 911 with any severe reactions post vaccine: Marland Kitchen Difficulty breathing  . Swelling of face and throat  . A fast heartbeat  . A bad rash all over body  . Dizziness and weakness   Immunizations Administered    Name Date Dose VIS Date Route   Moderna COVID-19 Vaccine 10/13/2019 12:35 PM 0.5 mL 06/01/2019 Intramuscular   Manufacturer: Moderna   Lot: [id number]   NDC: 25638-937-34

## 2019-11-07 IMAGING — MG DIGITAL DIAGNOSTIC UNILATERAL RIGHT MAMMOGRAM WITH TOMO AND CAD
8 series · 8 of 24 positions shown · non-contrast
Comparison: Previous exam(s).

CLINICAL DATA: The patient was called back for possible right
breast masses.

EXAM:
DIGITAL DIAGNOSTIC RIGHT MAMMOGRAM WITH CAD AND TOMO
ULTRASOUND RIGHT BREAST

[R ML synth-2D]
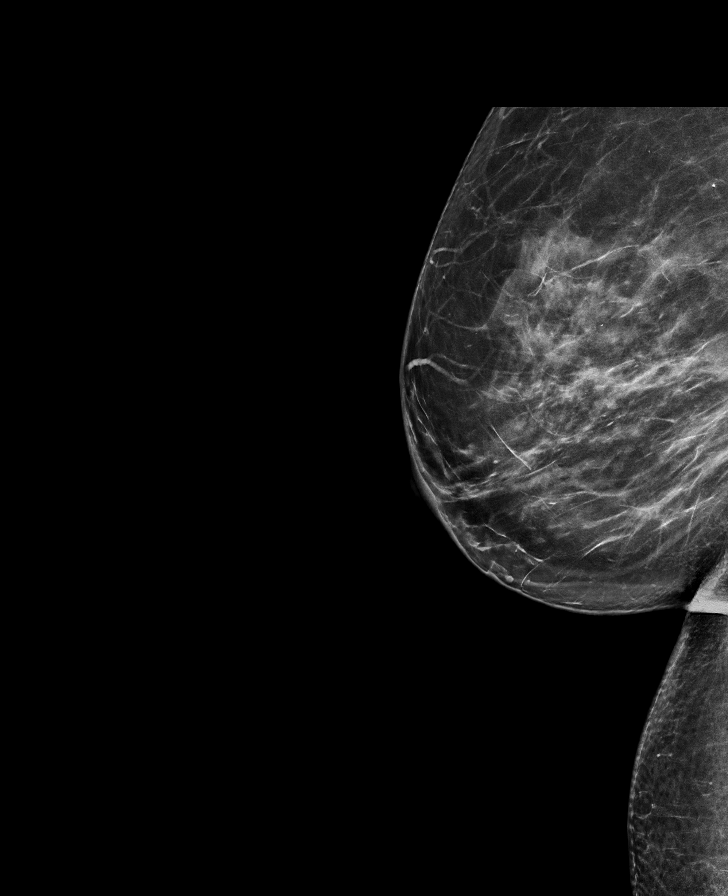

[R MLO synth-2D (1 of 2)]
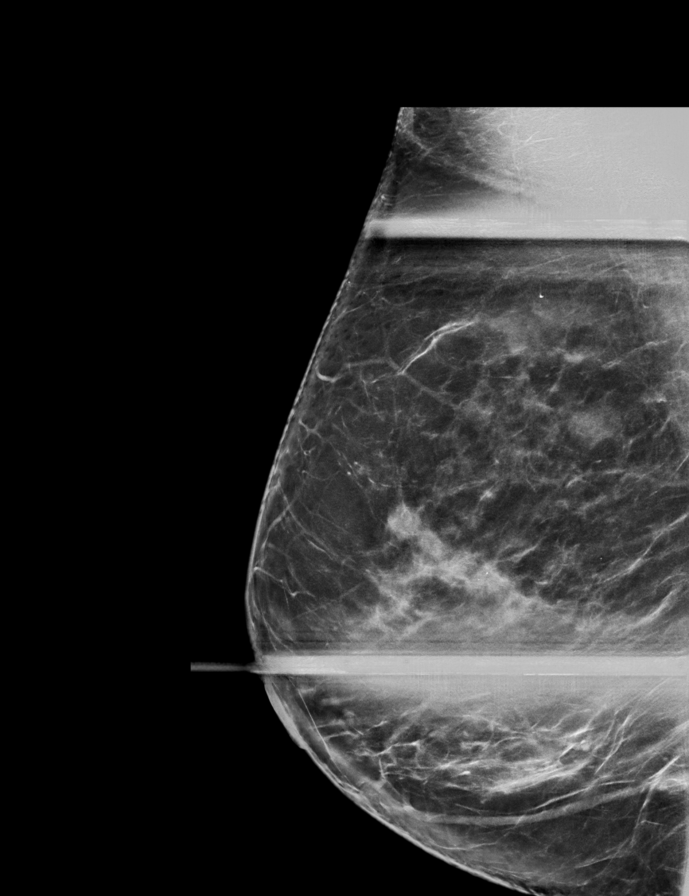

[R XCCL synth-2D]
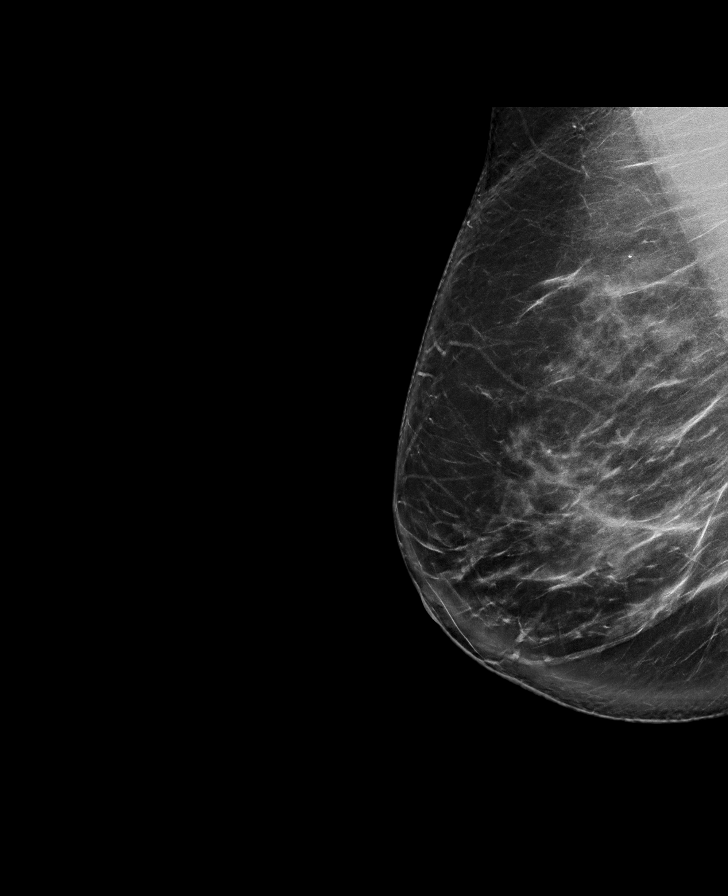

[R MLO synth-2D (2 of 2)]
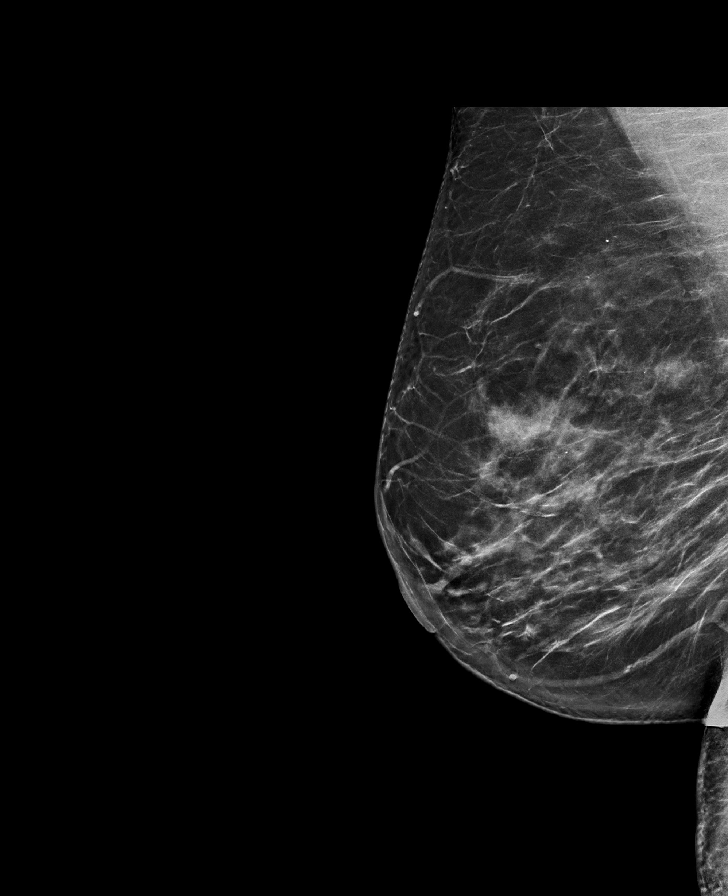

[R XCCL tomo · tomo slice 47/92.0]
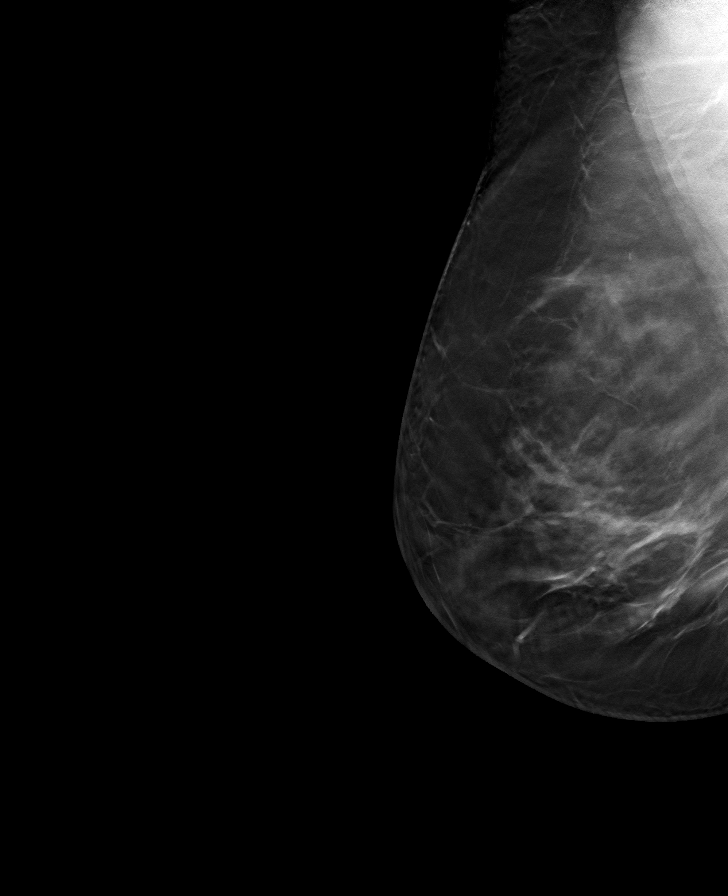

[R MLO tomo (1 of 2) · tomo slice 40/79.0]
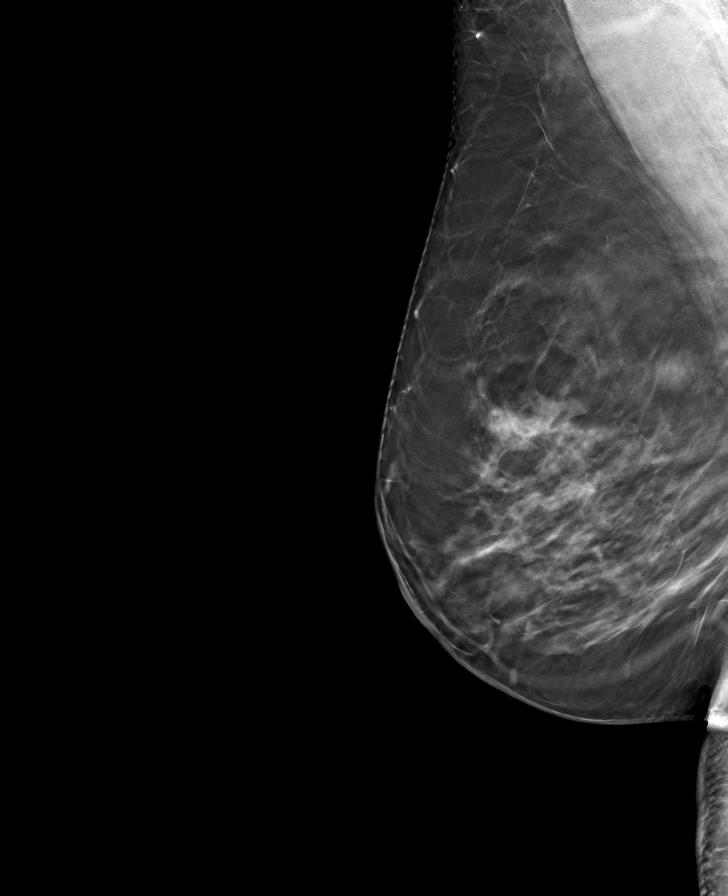

[R MLO tomo (2 of 2) · tomo slice 43/84.0]
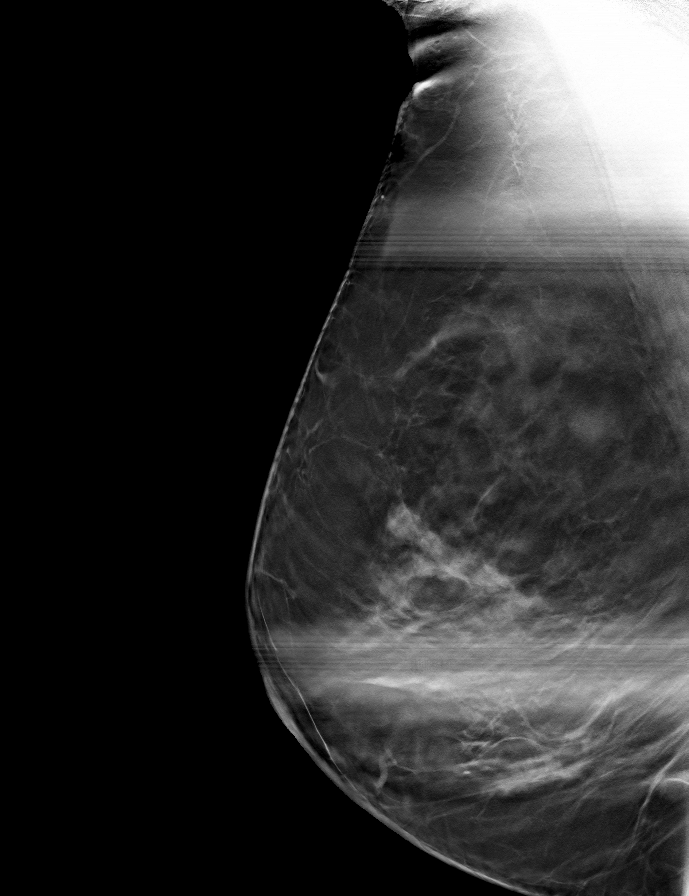

[R ML tomo · tomo slice 41/80.0]
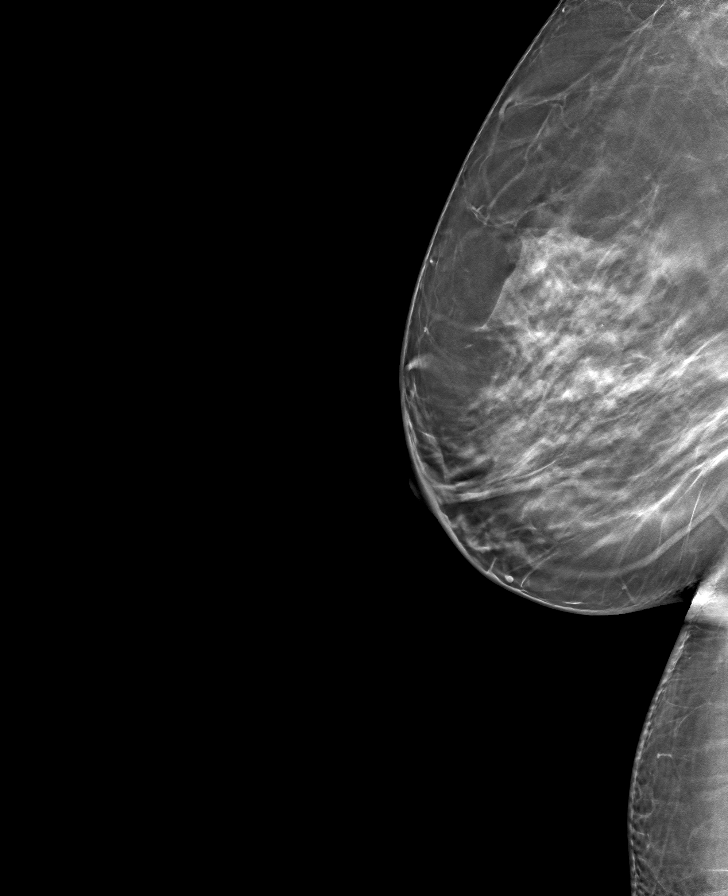

[8 of 24 positions shown; findings below may reference images not displayed]

ACR Breast Density Category c: The breast tissue is heterogeneously
dense, which may obscure small masses.
FINDINGS: The mass in the posterior lateral central right breast persists. The
asymmetry in the more anterior superior right breast improves but
does not completely resolve. I suspect glandular tissue with a
possible underlying mass. No other suspicious findings in the right
breast.

Mammographic images were processed with CAD.

On physical exam, no suspicious lumps are identified.

Targeted ultrasound is performed, showing multiple cysts in the
right breast accounting for mammographic findings. Dense glandular
tissue also seen in the superior right breast.
IMPRESSION: No mammographic or sonographic evidence of malignancy.

RECOMMENDATION:
Annual screening mammography.

I have discussed the findings and recommendations with the patient.
Results were also provided in writing at the conclusion of the
visit. If applicable, a reminder letter will be sent to the patient
regarding the next appointment.

BI-RADS CATEGORY  2: Benign.

## 2020-02-22 NOTE — Telephone Encounter (Signed)
error 

## 2021-07-26 ENCOUNTER — Ambulatory Visit (INDEPENDENT_AMBULATORY_CARE_PROVIDER_SITE_OTHER): Payer: Managed Care, Other (non HMO) | Admitting: Family

## 2021-07-26 ENCOUNTER — Other Ambulatory Visit: Payer: Self-pay

## 2021-07-26 ENCOUNTER — Encounter: Payer: Self-pay | Admitting: Family

## 2021-07-26 VITALS — BP 155/106 | HR 85 | Temp 98.8°F | Ht 61.0 in | Wt 210.4 lb

## 2021-07-26 DIAGNOSIS — F411 Generalized anxiety disorder: Secondary | ICD-10-CM

## 2021-07-26 DIAGNOSIS — Z1211 Encounter for screening for malignant neoplasm of colon: Secondary | ICD-10-CM | POA: Diagnosis not present

## 2021-07-26 DIAGNOSIS — R030 Elevated blood-pressure reading, without diagnosis of hypertension: Secondary | ICD-10-CM | POA: Diagnosis not present

## 2021-07-26 DIAGNOSIS — Z1231 Encounter for screening mammogram for malignant neoplasm of breast: Secondary | ICD-10-CM

## 2021-07-26 HISTORY — DX: Elevated blood-pressure reading, without diagnosis of hypertension: R03.0

## 2021-07-26 HISTORY — DX: Encounter for screening mammogram for malignant neoplasm of breast: Z12.31

## 2021-07-26 MED ORDER — BUPROPION HCL ER (SR) 100 MG PO TB12: 100.0000 mg | ORAL_TABLET | Freq: Every day | ORAL | 0 refills | Status: DC

## 2021-07-26 MED ORDER — HYDROXYZINE HCL 10 MG PO TABS: 10.0000 mg | ORAL_TABLET | Freq: Every evening | ORAL | 0 refills | Status: DC

## 2021-07-26 NOTE — Assessment & Plan Note (Signed)
pt denies having elevated BP in past, here today for anxiety, but pt also reports significant wt gain over the last year. Advised to check BP at home, let me know if running >130/90, drink at least 2 liters water qd, reduce sodium in diet, fit in exercise as able, work on eating less calories daily. Follow up in 1 month.

## 2021-07-26 NOTE — Assessment & Plan Note (Signed)
pt reports many new stressors in her life which are becoming overwhelming. Advised on strategies to reduce stress, handout provided. Starting Atarax qpm, Wellbutrin qam, advised on use & SE, f/u in 1 mo.

## 2021-07-26 NOTE — Patient Instructions (Addendum)
Welcome to [facility]! It was a pleasure meeting you today.  I have sent your medications to your pharmacy, you can start these today. We will discuss in 1 month how they are working for you, and we can combine this with a physical with fasting labs (no food or drink after midnight, water and black coffee or tea ok and any pills). Please see the handout on other strategies to help your anxiety.  Continue to monitor your blood pressure, let me know if you are getting any readings >130/90.  Remember to drink at least 2 liters of water daily and reduce any sodium in your diet. Continue to work on losing weight with less calories daily.  Exercise for just 15-65minutes a day can also help your blood pressure.     PLEASE NOTE:  If you had any LAB tests please let us know if you have not heard back within a few days. You may see your results on MyChart before we have a chance to review them but we will give you a call once they are reviewed by [place]. If we ordered any REFERRALS today, please let us know if you have not heard from their office within the next week.  Let us know through MyChart if you are needing REFILLS, or have your pharmacy send [place] the request. You can also use MyChart to communicate with me or any office staff.  Please try these tips to maintain a healthy lifestyle:  Eat most of your calories during the day when you are active. Eliminate processed foods including packaged sweets (pies, cakes, cookies), reduce intake of potatoes, white bread, white pasta, and white rice. Look for whole grain options, oat flour or almond flour.  Each meal should contain half fruits/vegetables, one quarter protein, and one quarter carbs (no bigger than a computer mouse).  Cut down on sweet beverages. This includes juice, soda, and sweet tea. Also watch fruit intake, though this is a healthier sweet option, it still contains natural sugar! Limit to 3 servings daily.  Drink  at least 1 glass of water with each meal and aim for at least 8 glasses per day  Exercise at least 150 minutes every week.

## 2021-07-26 NOTE — Progress Notes (Signed)
New Patient Office Visit  Subjective:  Patient ID: Nicole Barber, female    DOB: 1970/07/03  Age: 51 y.o. MRN: [id number]  CC:  Chief Complaint  Patient presents with   Establish Care   Anxiety    HPI Nicole Barber presents for establishing care and to discuss a new problem.  Anxiety: Patient complains of anxiety disorder.   She has the following symptoms: fatigue, feelings of losing control, irritable, palpitations, racing thoughts, sweating, difficulty sleeping .  Onset of symptoms was approximately  months ago, She denies current suicidal and homicidal ideation.  Possible organic causes contributing are: none.  Risk factors: negative life event cares full time for mother with dementia, also working, adult son with needs living in home.   Previous treatment includes no previous evaluation, meds or counseling.   Blood pressure problem: Patient is currently maintained on the following medications for blood pressure: none Pt reports a significant weight gain and increase in anxiety over last months to a year. Patient denies chest pain, has occasional headaches, no shortness of breath, mild swelling. Last 3 blood pressure readings in our office are as follows: BP Readings from Last 3 Encounters:  07/26/21 (!) 155/106  09/09/17 118/82  06/17/17 132/90   GAD 7 : Generalized Anxiety Score 07/26/2021  Nervous, Anxious, on Edge 2  Control/stop worrying 2  Worry too much - different things 3  Trouble relaxing 3  Restless 2  Easily annoyed or irritable 1  Afraid - awful might happen 3  Total GAD 7 Score 16  Anxiety Difficulty Somewhat difficult    History reviewed. No pertinent past medical history.   Past Surgical History:  Procedure Laterality Date   ABLATION     TONSILLECTOMY     TUBAL LIGATION      Family History  Problem Relation Age of Onset   Asthma Mother    Diabetes Father    Heart disease Father    Allergic rhinitis Brother     Social History    Socioeconomic History   Marital status: Married    Spouse name: Not on file   Number of children: Not on file   Years of education: Not on file   Highest education level: Not on file  Occupational History   Not on file  Tobacco Use   Smoking status: Never   Smokeless tobacco: Never  Substance and Sexual Activity   Alcohol use: Yes    Alcohol/week: 1.0 standard drink    Types: 1 Glasses of wine per week   Drug use: No   Sexual activity: Yes  Other Topics Concern   Not on file  Social History Narrative   Not on file   Social Determinants of Health   Financial Resource Strain: Not on file  Food Insecurity: Not on file  Transportation Needs: Not on file  Physical Activity: Not on file  Stress: Not on file  Social Connections: Not on file  Intimate Partner Violence: Not on file    Objective:   Today's Vitals: BP (!) 155/106    Pulse 85    Temp 98.8 F (37.1 C) (Temporal)    Ht 5\' 1"  (1.549 m)    Wt 210 lb 6.4 oz (95.4 kg)    LMP 07/04/2021 (Exact Date)    SpO2 96%    BMI 39.75 kg/m   Physical Exam Vitals and nursing note reviewed.  Constitutional:      Appearance: Normal appearance. She is morbidly obese.  Cardiovascular:  Rate and Rhythm: Normal rate and regular rhythm.  Pulmonary:     Effort: Pulmonary effort is normal.     Breath sounds: Normal breath sounds.  Musculoskeletal:        General: Normal range of motion.  Skin:    General: Skin is warm and dry.  Neurological:     Mental Status: She is alert.  Psychiatric:        Mood and Affect: Mood normal.        Behavior: Behavior normal.    Assessment & Plan:   Problem List Items Addressed This Visit       Other   Encounter for screening mammogram for breast cancer   Relevant Orders   MM Digital Screening   Generalized anxiety disorder - Primary    pt reports many new stressors in her life which are becoming overwhelming. Advised on strategies to reduce stress, handout provided. Starting Atarax  qpm, Wellbutrin qam, advised on use & SE, f/u in 1 mo.      Relevant Medications   hydrOXYzine (ATARAX) 10 MG tablet   buPROPion ER (WELLBUTRIN SR) 100 MG 12 hr tablet   Elevated blood pressure reading    pt denies having elevated BP in past, here today for anxiety, but pt also reports significant wt gain over the last year. Advised to check BP at home, let me know if running >130/90, drink at least 2 liters water qd, reduce sodium in diet, fit in exercise as able, work on eating less calories daily. Follow up in 1 month.      Other Visit Diagnoses     Screening for colon cancer       Relevant Orders   Ambulatory referral to Gastroenterology       Outpatient Encounter Medications as of 07/26/2021  Medication Sig   beclomethasone (QVAR) 80 MCG/ACT inhaler Inhale 2 puffs into the lungs 2 (two) times daily.   buPROPion ER (WELLBUTRIN SR) 100 MG 12 hr tablet Take 1 tablet (100 mg total) by mouth daily. Start with one pill in the morning for 3-4 days, then increase to twice a day.   cetirizine (ZYRTEC) 10 MG tablet Take 10 mg by mouth daily.   hydrOXYzine (ATARAX) 10 MG tablet Take 1-2 tablets (10-20 mg total) by mouth every evening.   [DISCONTINUED] albuterol (PROVENTIL HFA;VENTOLIN HFA) 108 (90 Base) MCG/ACT inhaler Inhale 2 puffs into the lungs every 6 (six) hours as needed for wheezing. (Patient not taking: Reported on 07/26/2021)   [DISCONTINUED] amoxicillin-clavulanate (AUGMENTIN) 875-125 MG tablet Take 1 tablet by mouth 2 (two) times daily. (Patient not taking: Reported on 07/26/2021)   [DISCONTINUED] azithromycin (ZITHROMAX Z-PAK) 250 MG tablet Take 2 tablets (500 mg) on  Day 1,  followed by 1 tablet (250 mg) once daily on Days 2 through 5. (Patient not taking: Reported on 07/26/2021)   [DISCONTINUED] Fluticasone-Salmeterol (ADVAIR DISKUS) 250-50 MCG/DOSE AEPB Inhale 1 puff into the lungs 2 (two) times daily. (Patient not taking: Reported on 07/26/2021)   [DISCONTINUED]  HYDROcodone-homatropine (HYCODAN) 5-1.5 MG/5ML syrup 1 tsp tid prn home use only (Patient not taking: Reported on 07/26/2021)   [DISCONTINUED] Melatonin 1 MG TABS Take by mouth as needed. (Patient not taking: Reported on 07/26/2021)   [DISCONTINUED] montelukast (SINGULAIR) 10 MG tablet Take 1 tablet (10 mg total) by mouth at bedtime. (Patient not taking: Reported on 07/26/2021)   [DISCONTINUED] NON FORMULARY Allergy Immunotherapy (Patient not taking: Reported on 07/26/2021)   [DISCONTINUED] ranitidine (ZANTAC) 150 MG/10ML syrup Take 10  mLs (150 mg total) by mouth 2 (two) times daily. (Patient not taking: Reported on 07/26/2021)   [DISCONTINUED] topiramate (TOPAMAX) 100 MG tablet Take 100 mg by mouth 2 (two) times daily. Reported on 11/01/2015 (Patient not taking: Reported on 07/26/2021)   No facility-administered encounter medications on file as of 07/26/2021.    Follow-up: Return in about 4 weeks (around 08/23/2021) for Complete physical w/fasting labs, and PAP smear, f/u anxiety.   Dulce SellarHudnell, Marcy Bogosian, NP

## 2021-08-08 ENCOUNTER — Encounter: Payer: Self-pay | Admitting: Family

## 2021-08-08 DIAGNOSIS — I10 Essential (primary) hypertension: Secondary | ICD-10-CM

## 2021-08-09 DIAGNOSIS — I10 Essential (primary) hypertension: Secondary | ICD-10-CM | POA: Insufficient documentation

## 2021-08-09 MED ORDER — AMLODIPINE BESYLATE 5 MG PO TABS: 5.0000 mg | ORAL_TABLET | Freq: Every day | ORAL | 0 refills | Status: DC

## 2021-08-13 ENCOUNTER — Other Ambulatory Visit: Payer: Self-pay | Admitting: Family

## 2021-08-13 DIAGNOSIS — I10 Essential (primary) hypertension: Secondary | ICD-10-CM

## 2021-08-15 ENCOUNTER — Other Ambulatory Visit: Payer: Self-pay

## 2021-08-15 ENCOUNTER — Ambulatory Visit
Admission: RE | Admit: 2021-08-15 | Discharge: 2021-08-15 | Disposition: A | Discharge status: Home / Self Care | Payer: Managed Care, Other (non HMO) | Source: Ambulatory Visit | Attending: Family | Admitting: Family

## 2021-08-15 DIAGNOSIS — Z1231 Encounter for screening mammogram for malignant neoplasm of breast: Secondary | ICD-10-CM

## 2021-08-16 ENCOUNTER — Other Ambulatory Visit: Payer: Self-pay | Admitting: Family

## 2021-08-16 DIAGNOSIS — R928 Other abnormal and inconclusive findings on diagnostic imaging of breast: Secondary | ICD-10-CM

## 2021-08-28 ENCOUNTER — Encounter: Payer: Self-pay | Admitting: Family

## 2021-08-28 ENCOUNTER — Other Ambulatory Visit (HOSPITAL_COMMUNITY)
Admission: RE | Admit: 2021-08-28 | Discharge: 2021-08-28 | Disposition: A | Discharge status: Home / Self Care | Payer: Managed Care, Other (non HMO) | Source: Ambulatory Visit | Attending: Family | Admitting: Family

## 2021-08-28 ENCOUNTER — Ambulatory Visit (INDEPENDENT_AMBULATORY_CARE_PROVIDER_SITE_OTHER): Payer: Managed Care, Other (non HMO) | Admitting: Family

## 2021-08-28 VITALS — BP 133/94 | HR 91 | Temp 97.9°F | Ht 61.0 in | Wt 208.6 lb

## 2021-08-28 DIAGNOSIS — Z0000 Encounter for general adult medical examination without abnormal findings: Secondary | ICD-10-CM

## 2021-08-28 DIAGNOSIS — F411 Generalized anxiety disorder: Secondary | ICD-10-CM

## 2021-08-28 DIAGNOSIS — I10 Essential (primary) hypertension: Secondary | ICD-10-CM

## 2021-08-28 LAB — CBC WITH DIFFERENTIAL/PLATELET
Basophils Absolute: 0.1 10*3/uL (ref 0.0–0.1)
Basophils Relative: 0.9 % (ref 0.0–3.0)
Eosinophils Absolute: 0.2 10*3/uL (ref 0.0–0.7)
Eosinophils Relative: 3.1 % (ref 0.0–5.0)
HCT: 44.2 % (ref 36.0–46.0)
Hemoglobin: 14.4 g/dL (ref 12.0–15.0)
Lymphocytes Relative: 36.5 % (ref 12.0–46.0)
Lymphs Abs: 2.2 10*3/uL (ref 0.7–4.0)
MCHC: 32.7 g/dL (ref 30.0–36.0)
MCV: 85.7 fl (ref 78.0–100.0)
Monocytes Absolute: 0.7 10*3/uL (ref 0.1–1.0)
Monocytes Relative: 12 % (ref 3.0–12.0)
Neutro Abs: 2.8 10*3/uL (ref 1.4–7.7)
Neutrophils Relative %: 47.5 % (ref 43.0–77.0)
Platelets: 265 10*3/uL (ref 150.0–400.0)
RBC: 5.15 Mil/uL — ABNORMAL HIGH (ref 3.87–5.11)
RDW: 14.3 % (ref 11.5–15.5)
WBC: 5.9 10*3/uL (ref 4.0–10.5)

## 2021-08-28 LAB — LIPID PANEL
Cholesterol: 136 mg/dL (ref 0–200)
HDL: 42.1 mg/dL (ref >39.00)
LDL Cholesterol: 77 mg/dL (ref 0–99)
NonHDL: 93.61
Total CHOL/HDL Ratio: 3
Triglycerides: 82 mg/dL (ref 0.0–149.0)
VLDL: 16.4 mg/dL (ref 0.0–40.0)

## 2021-08-28 LAB — COMPREHENSIVE METABOLIC PANEL
ALT: 19 U/L (ref 0–35)
AST: 22 U/L (ref 0–37)
Albumin: 4.4 g/dL (ref 3.5–5.2)
Alkaline Phosphatase: 90 U/L (ref 39–117)
BUN: 14 mg/dL (ref 6–23)
CO2: 29 mEq/L (ref 19–32)
Calcium: 9.8 mg/dL (ref 8.4–10.5)
Chloride: 102 mEq/L (ref 96–112)
Creatinine, Ser: 0.73 mg/dL (ref 0.40–1.20)
GFR: 97.36 mL/min (ref >60.00)
Glucose, Bld: 88 mg/dL (ref 70–99)
Potassium: 4.4 mEq/L (ref 3.5–5.1)
Sodium: 139 mEq/L (ref 135–145)
Total Bilirubin: 0.5 mg/dL (ref 0.2–1.2)
Total Protein: 7.8 g/dL (ref 6.0–8.3)

## 2021-08-28 LAB — TSH: TSH: 2.19 u[IU]/mL (ref 0.35–5.50)

## 2021-08-28 MED ORDER — HYDROXYZINE HCL 10 MG PO TABS: 10.0000 mg | ORAL_TABLET | Freq: Every evening | ORAL | 5 refills | Status: DC

## 2021-08-28 MED ORDER — BUPROPION HCL ER (SR) 100 MG PO TB12: 100.0000 mg | ORAL_TABLET | Freq: Every day | ORAL | 5 refills | Status: DC

## 2021-08-28 MED ORDER — AMLODIPINE BESYLATE 5 MG PO TABS: 5.0000 mg | ORAL_TABLET | Freq: Every day | ORAL | 5 refills | Status: DC

## 2021-08-28 NOTE — Assessment & Plan Note (Addendum)
New - started Amlodipine - BP doing better, but still high at times, she is monitoring at home, advised to let me know if consistently running > 130/90. sending refill, f/u in 3 mos.

## 2021-08-28 NOTE — Assessment & Plan Note (Addendum)
Chronic - doing much better on Bupropion - just taking it daily vs bid, and taking Hydroxyzine in the evenings, sending refills today, f/u in 3 mos.

## 2021-08-28 NOTE — Progress Notes (Signed)
Phone [phone]   Subjective:   Patient is a 51 y.o. female presenting for annual physical.    Chief Complaint  Patient presents with   Annual Exam    FASTING   Hypertension   Gynecologic Exam   Anxiety   HPI: Hypertension: Patient is currently maintained on the following medications for blood pressure: Amlodipine Failed meds include: none Patient reports good compliance with blood pressure medications. Patient denies chest pain, headaches, shortness of breath or swelling. Last 3 blood pressure readings in our office are as follows: BP Readings from Last 3 Encounters:  08/28/21 (!) 133/94  07/26/21 (!) 155/106  09/09/17 118/82  Anxiety/Depression: Patient complains of anxiety disorder.   She has the following symptoms: fatigue, feelings of losing control, irritable, palpitations, racing thoughts, sweating, difficulty sleeping.  Onset of symptoms was approximately  months ago, She denies current suicidal and homicidal ideation.  Possible organic causes contributing are: none.  Risk factors: negative life event cares full time for mother with dementia, also working, adult son with needs living in home. Previous treatment includes no previous evaluation, meds or counseling. Pt started Bupropion 1 month ago and Hydroxyzine in evenings, reports improvement in sx and tolerating.  See problem oriented charting- ROS- full  review of systems was completed and negative except what is noted in HPI above.   The following were reviewed and entered/updated in epic: Past Medical History:  Diagnosis Date   Elevated blood pressure reading 07/26/2021   Headache 11/09/2014   Migraine issues   Patient Active Problem List   Diagnosis Date Noted   Essential hypertension 08/09/2021   Encounter for screening mammogram for breast cancer 07/26/2021   Generalized anxiety disorder 07/26/2021   Moderate persistent asthma 11/01/2015   Perennial and seasonal allergic rhinitis 11/01/2015   GERD  (gastroesophageal reflux disease) 11/01/2015   Asthma with acute exacerbation 06/22/2015   Reactive airway disease 11/09/2014   OTHER TENOSYNOVITIS OF HAND AND WRIST 09/15/2008   Past Surgical History:  Procedure Laterality Date   ABLATION     TONSILLECTOMY     TUBAL LIGATION      Family History  Problem Relation Age of Onset   Asthma Mother    Diabetes Father    Heart disease Father    Allergic rhinitis Brother     Medications- reviewed and updated Current Outpatient Medications  Medication Sig Dispense Refill   amoxicillin-clavulanate (AUGMENTIN) 500-125 MG tablet Take 1 tablet by mouth 3 (three) times daily.     beclomethasone (QVAR) 80 MCG/ACT inhaler Inhale 2 puffs into the lungs 2 (two) times daily. 1 Inhaler 5   cetirizine (ZYRTEC) 10 MG tablet Take 10 mg by mouth daily.     ibuprofen (ADVIL) 800 MG tablet Take 800 mg by mouth every 6 (six) hours as needed.     amLODipine (NORVASC) 5 MG tablet Take 1 tablet (5 mg total) by mouth daily. 30 tablet 5   buPROPion ER (WELLBUTRIN SR) 100 MG 12 hr tablet Take 1 tablet (100 mg total) by mouth daily. 30 tablet 5   hydrOXYzine (ATARAX) 10 MG tablet Take 1-2 tablets (10-20 mg total) by mouth every evening. 45 tablet 5   No current facility-administered medications for this visit.    Allergies-reviewed and updated No Known Allergies  Social History   Social History Narrative   Not on file   Objective  Objective:  BP (!) 133/94    Pulse 91    Temp 97.9 F (36.6 C) (Temporal)  Ht 5\' 1"  (1.549 m)    Wt 208 lb 9.6 oz (94.6 kg)    SpO2 99%    BMI 39.41 kg/m  Gen: NAD, resting comfortably HEENT: Mucous membranes are moist. Oropharynx normal Neck: no thyromegaly CV: RRR no murmurs rubs or gallops Lungs: CTAB no crackles, wheeze, rhonchi Abdomen: soft/nontender/nondistended/normal bowel sounds. No rebound or guarding.  Ext: no edema Skin: warm, dry Neuro: grossly normal, moves all extremities, PERRLA Female genitalia:  normal external genitalia, vulva, vagina, cervix, uterus and adnexa Vulva: normal appearing vulva with no masses, tenderness or lesions Vagina: normal appearing vagina with normal color and discharge, no lesions; PAP smear specimen obtained.     Assessment and Plan   Health Maintenance counseling: 1. Anticipatory guidance: Patient counseled regarding regular dental exams q6 months, eye exams,  avoiding smoking and second hand smoke, limiting alcohol to 1 beverage per day, no illicit drugs.   2. Risk factor reduction:  Advised patient of need for regular exercise and diet rich with fruits and vegetables to reduce risk of heart attack and stroke.  Wt Readings from Last 3 Encounters:  08/28/21 208 lb 9.6 oz (94.6 kg)  07/26/21 210 lb 6.4 oz (95.4 kg)  09/09/17 196 lb (88.9 kg)   3. Immunizations/screenings/ancillary studies Immunization History  Administered Date(s) Administered   Influenza-Unspecified 03/31/2018, 03/31/2021   Moderna Sars-Covid-2 Vaccination 09/10/2019, 10/13/2019   Health Maintenance Due  Topic Date Due   HIV Screening  Never done   Hepatitis C Screening  Never done   TETANUS/TDAP  Never done   PAP SMEAR-Modifier  Never done   COLONOSCOPY (Pts 45-53yrs Insurance coverage will need to be confirmed)  Never done    4. Cervical cancer screening- today. 5. Breast cancer screening-  mammogram: 2023 6. Colon cancer screening - N/A 7. Skin cancer screening- advised regular sunscreen use. Denies worrisome, changing, or new skin lesions.  8. Birth control/STD check- [place], denies need for testing 9. Osteoporosis screening- N/A 10. Smoking associated screening (lung cancer screening, AAA screen 65-75, UA)- non- smoker  Problem List Items Addressed This Visit       Cardiovascular and Mediastinum   Essential hypertension    New - started Amlodipine - BP doing better, but still high at times, she is monitoring at home, advised to let me know if consistently running >  130/90. sending refill, f/u in 3 mos.      Relevant Medications   amLODipine (NORVASC) 5 MG tablet     Other   Generalized anxiety disorder    Chronic - doing much better on Bupropion - just taking it daily vs bid, and taking Hydroxyzine in the evenings, sending refills today, f/u in 3 mos.      Relevant Medications   hydrOXYzine (ATARAX) 10 MG tablet   buPROPion ER (WELLBUTRIN SR) 100 MG 12 hr tablet   Other Visit Diagnoses     Annual physical exam    -  Primary   Relevant Orders   Comprehensive metabolic panel   TSH   Lipid panel   CBC with Differential/Platelet   Cytology - PAP       Recommended follow up: Return in about 3 months (around 11/25/2021) for HTN, med refills. Future Appointments  Date Time Provider [place]  09/10/2021 10:50 AM [facility] DIAG TOMO 2 [facility]  09/10/2021 11:00 AM [facility] [place] 2 [facility]  09/10/2021 11:05 AM [facility] [place] 2 [facility]  11/27/2021  9:00 AM Jeanie Sewer, NP [facility]  [facility]    Lab/Order associations: fasting   ICD-10-CM   1. Annual physical exam  Z00.00 Comprehensive metabolic panel    TSH    Lipid panel    CBC with Differential/Platelet    Cytology - PAP    2. Generalized anxiety disorder  F41.1 hydrOXYzine (ATARAX) 10 MG tablet    buPROPion ER (WELLBUTRIN SR) 100 MG 12 hr tablet    3. Essential hypertension  I10 amLODipine (NORVASC) 5 MG tablet      Meds ordered this encounter  Medications   hydrOXYzine (ATARAX) 10 MG tablet    Sig: Take 1-2 tablets (10-20 mg total) by mouth every evening.    Dispense:  45 tablet    Refill:  5    Order Specific Question:   Supervising Provider    Answer:   ANDY, CAMILLE L [[id number]]   buPROPion ER (WELLBUTRIN SR) 100 MG 12 hr tablet    Sig: Take 1 tablet (100 mg total) by mouth daily.    Dispense:  30 tablet    Refill:  5    Order Specific Question:   Supervising Provider    Answer:   ANDY, CAMILLE L [[id number]]   amLODipine (NORVASC) 5 MG tablet     Sig: Take 1 tablet (5 mg total) by mouth daily.    Dispense:  30 tablet    Refill:  5    Order Specific Question:   Supervising Provider    Answer:   ANDY, CAMILLE L [id number]    Jeanie Sewer, NP

## 2021-08-28 NOTE — Patient Instructions (Addendum)
It was very nice to see you today!  Go to the lab for blood work today. I have sent your refills to the pharmacy. Keep monitoring your blood pressure and let me know if consistently running higher than 130/90. Remember to drink at least 8 cups of water daily and reduce the sodium in your diet.  Schedule a 3 month follow up visit today for refills.    PLEASE NOTE:  If you had any lab tests please let us know if you have not heard back within a few days. You may see your results on MyChart before we have a chance to review them but we will give you a call once they are reviewed by [place]. If we ordered any referrals today, please let us know if you have not heard from their office within the next week.   Please try these tips to maintain a healthy lifestyle:  Eat most of your calories during the day when you are active. Eliminate processed foods including packaged sweets (pies, cakes, cookies), reduce intake of potatoes, white bread, white pasta, and white rice. Look for whole grain options, oat flour or almond flour.  Each meal should contain half fruits/vegetables, one quarter protein, and one quarter carbs (no bigger than a computer mouse).  Cut down on sweet beverages. This includes juice, soda, and sweet tea. Also watch fruit intake, though this is a healthier sweet option, it still contains natural sugar! Limit to 3 servings daily.  Drink at least 1 glass of water with each meal and aim for at least 8 glasses per day  Exercise at least 150 minutes every week.

## 2021-08-31 LAB — CYTOLOGY - PAP
Adequacy: ABSENT
Comment: NEGATIVE
Diagnosis: NEGATIVE
High risk HPV: NEGATIVE

## 2021-09-10 ENCOUNTER — Ambulatory Visit: Payer: Managed Care, Other (non HMO)

## 2021-09-10 ENCOUNTER — Ambulatory Visit
Admission: RE | Admit: 2021-09-10 | Discharge: 2021-09-10 | Disposition: A | Discharge status: Home / Self Care | Payer: Managed Care, Other (non HMO) | Source: Ambulatory Visit | Attending: Family | Admitting: Family

## 2021-09-10 ENCOUNTER — Other Ambulatory Visit: Payer: Self-pay

## 2021-09-10 DIAGNOSIS — R928 Other abnormal and inconclusive findings on diagnostic imaging of breast: Secondary | ICD-10-CM

## 2021-09-26 ENCOUNTER — Encounter: Payer: Self-pay | Admitting: Family

## 2021-09-27 MED ORDER — HYDROCHLOROTHIAZIDE 12.5 MG PO TABS: 12.5000 mg | ORAL_TABLET | Freq: Every morning | ORAL | 0 refills | Status: DC

## 2021-10-30 ENCOUNTER — Other Ambulatory Visit: Payer: Self-pay | Admitting: Family

## 2021-11-27 ENCOUNTER — Encounter: Payer: Self-pay | Admitting: Family

## 2021-11-27 ENCOUNTER — Ambulatory Visit (INDEPENDENT_AMBULATORY_CARE_PROVIDER_SITE_OTHER): Payer: BC Managed Care – PPO | Admitting: Family

## 2021-11-27 DIAGNOSIS — I10 Essential (primary) hypertension: Secondary | ICD-10-CM

## 2021-11-27 MED ORDER — HYDROCHLOROTHIAZIDE 12.5 MG PO TABS: 12.5000 mg | ORAL_TABLET | Freq: Every morning | ORAL | 1 refills | Status: DC

## 2021-11-27 NOTE — Progress Notes (Signed)
Subjective:     Patient ID: Nicole Barber, female    DOB: 17-Jun-1971, 51 y.o.   MRN: [id number]  HPI: Blood pressure problem: Patient is currently maintained on the following medications for blood pressure: none Pt reports a significant weight gain and increase in anxiety over last months to a year. Patient denies chest pain, has occasional headaches, no shortness of breath, mild swelling. Last 3 blood pressure readings in our office are as follows: BP Readings from Last 3 Encounters:  11/27/21 (!) 138/98  08/28/21 (!) 133/94  07/26/21 (!) 155/106    Assessment & Plan:   Problem List Items Addressed This Visit       Cardiovascular and Mediastinum   Essential hypertension    BP better, pt tolerating Amlodipine & HCTZ, sending refills. f/u 4 mos.       Relevant Medications   hydrochlorothiazide (HYDRODIURIL) 12.5 MG tablet    Outpatient Medications Prior to Visit  Medication Sig Dispense Refill   amLODipine (NORVASC) 5 MG tablet Take 1 tablet (5 mg total) by mouth daily. 30 tablet 5   beclomethasone (QVAR) 80 MCG/ACT inhaler Inhale 2 puffs into the lungs 2 (two) times daily. 1 Inhaler 5   buPROPion ER (WELLBUTRIN SR) 100 MG 12 hr tablet Take 1 tablet (100 mg total) by mouth daily. 30 tablet 5   cetirizine (ZYRTEC) 10 MG tablet Take 10 mg by mouth daily.     hydrOXYzine (ATARAX) 10 MG tablet Take 1-2 tablets (10-20 mg total) by mouth every evening. 45 tablet 5   ibuprofen (ADVIL) 800 MG tablet Take 800 mg by mouth every 6 (six) hours as needed.     amoxicillin-clavulanate (AUGMENTIN) 500-125 MG tablet Take 1 tablet by mouth 3 (three) times daily.     hydrochlorothiazide (HYDRODIURIL) 12.5 MG tablet TAKE 1 TABLET BY MOUTH IN THE MORNING 30 tablet 0   No facility-administered medications prior to visit.    Past Medical History:  Diagnosis Date   Elevated blood pressure reading 07/26/2021   Headache 11/09/2014   Migraine issues    Past Surgical History:  Procedure  Laterality Date   ABLATION     TONSILLECTOMY     TUBAL LIGATION      No Known Allergies     Objective:    Physical Exam Vitals and nursing note reviewed.  Constitutional:      Appearance: Normal appearance. She is obese.  Cardiovascular:     Rate and Rhythm: Normal rate and regular rhythm.  Pulmonary:     Effort: Pulmonary effort is normal.     Breath sounds: Normal breath sounds.  Musculoskeletal:        General: Normal range of motion.  Skin:    General: Skin is warm and dry.  Neurological:     Mental Status: She is alert.  Psychiatric:        Mood and Affect: Mood normal.        Behavior: Behavior normal.    BP (!) 138/98 (BP Location: Left Arm, Patient Position: Sitting, Cuff Size: Normal)   Pulse 82   Temp 98.4 F (36.9 C) (Temporal)   Ht 5\' 1"  (1.549 m)   Wt 204 lb 6 oz (92.7 kg)   LMP 10/04/2021 (Approximate)   SpO2 98%   BMI 38.62 kg/m  Wt Readings from Last 3 Encounters:  11/27/21 204 lb 6 oz (92.7 kg)  08/28/21 208 lb 9.6 oz (94.6 kg)  07/26/21 210 lb 6.4 oz (95.4 kg)  Meds ordered this encounter  Medications   hydrochlorothiazide (HYDRODIURIL) 12.5 MG tablet    Sig: Take 1 tablet (12.5 mg total) by mouth every morning.    Dispense:  90 tablet    Refill:  1    Order Specific Question:   Supervising Provider    Answer:   ANDY, CAMILLE L [[id number]]    Dulce Sellar, NP

## 2021-11-27 NOTE — Patient Instructions (Addendum)
It was very nice to see you today!  I have sent in a refill of your HCTZ, continue to take every morning. Try to move your Amlodipine to evenings or bedtime and see if better for your BP readings. Remember to hydrate at least 2 liters water daily.  Good job with increasing your exercise and losing some weight!! Keep up the good work :-)  Follow up in 4 months or sooner if needed.     PLEASE NOTE:  If you had any lab tests please let us know if you have not heard back within a few days. You may see your results on MyChart before we have a chance to review them but we will give you a call once they are reviewed by [place]. If we ordered any referrals today, please let us know if you have not heard from their office within the next week.

## 2021-11-27 NOTE — Assessment & Plan Note (Addendum)
BP better, pt tolerating Amlodipine & HCTZ, sending refills. f/u 4 mos.

## 2022-03-25 ENCOUNTER — Encounter: Payer: Self-pay | Admitting: *Deleted

## 2022-03-31 ENCOUNTER — Encounter: Payer: Self-pay | Admitting: Family

## 2022-03-31 NOTE — Progress Notes (Unsigned)
   Patient ID: Nicole Barber, female    DOB: 12-04-1970, 51 y.o.   MRN: [id number]  No chief complaint on file.   HPI: Blood pressure problem: Patient is currently maintained on the following medications for blood pressure: none Pt reports a significant weight gain and increase in anxiety over last months to a year. Patient denies chest pain, has occasional headaches, no shortness of breath, mild swelling. Anxiety: Patient complains of anxiety disorder.   She has the following symptoms: fatigue, feelings of losing control, irritable, palpitations, racing thoughts, sweating, difficulty sleeping.  Onset of symptoms was approximately  months ago, She denies current suicidal and homicidal ideation.  Possible organic causes contributing are: none.  Risk factors: negative life event cares full time for mother with dementia, also working, adult son with needs living in home.  Previous treatment includes no previous evaluation, meds or counseling.    Assessment & Plan:   Problem List Items Addressed This Visit   None   Subjective:    Outpatient Medications Prior to Visit  Medication Sig Dispense Refill  . amLODipine (NORVASC) 5 MG tablet Take 1 tablet (5 mg total) by mouth daily. 30 tablet 5  . beclomethasone (QVAR) 80 MCG/ACT inhaler Inhale 2 puffs into the lungs 2 (two) times daily. 1 Inhaler 5  . buPROPion ER (WELLBUTRIN SR) 100 MG 12 hr tablet Take 1 tablet (100 mg total) by mouth daily. 30 tablet 5  . cetirizine (ZYRTEC) 10 MG tablet Take 10 mg by mouth daily.    . hydrochlorothiazide (HYDRODIURIL) 12.5 MG tablet Take 1 tablet (12.5 mg total) by mouth every morning. 90 tablet 1  . hydrOXYzine (ATARAX) 10 MG tablet Take 1-2 tablets (10-20 mg total) by mouth every evening. 45 tablet 5  . ibuprofen (ADVIL) 800 MG tablet Take 800 mg by mouth every 6 (six) hours as needed.     No facility-administered medications prior to visit.   Past Medical History:  Diagnosis Date  . Elevated  blood pressure reading 07/26/2021  . Headache 11/09/2014   Migraine issues   Past Surgical History:  Procedure Laterality Date  . ABLATION    . TONSILLECTOMY    . TUBAL LIGATION     No Known Allergies    Objective:    Physical Exam Vitals and nursing note reviewed.  Constitutional:      Appearance: Normal appearance.  Cardiovascular:     Rate and Rhythm: Normal rate and regular rhythm.  Pulmonary:     Effort: Pulmonary effort is normal.     Breath sounds: Normal breath sounds.  Musculoskeletal:        General: Normal range of motion.  Skin:    General: Skin is warm and dry.  Neurological:     Mental Status: She is alert.  Psychiatric:        Mood and Affect: Mood normal.        Behavior: Behavior normal.  There were no vitals taken for this visit. Wt Readings from Last 3 Encounters:  11/27/21 204 lb 6 oz (92.7 kg)  08/28/21 208 lb 9.6 oz (94.6 kg)  07/26/21 210 lb 6.4 oz (95.4 kg)       Jeanie Sewer, NP

## 2022-04-01 ENCOUNTER — Ambulatory Visit (INDEPENDENT_AMBULATORY_CARE_PROVIDER_SITE_OTHER): Payer: BC Managed Care – PPO | Admitting: Family

## 2022-04-01 ENCOUNTER — Encounter: Payer: Self-pay | Admitting: Family

## 2022-04-01 VITALS — BP 144/94 | HR 74 | Temp 97.7°F | Ht 61.0 in | Wt 205.1 lb

## 2022-04-01 DIAGNOSIS — F411 Generalized anxiety disorder: Secondary | ICD-10-CM

## 2022-04-01 DIAGNOSIS — N921 Excessive and frequent menstruation with irregular cycle: Secondary | ICD-10-CM | POA: Diagnosis not present

## 2022-04-01 DIAGNOSIS — Z23 Encounter for immunization: Secondary | ICD-10-CM | POA: Diagnosis not present

## 2022-04-01 DIAGNOSIS — I10 Essential (primary) hypertension: Secondary | ICD-10-CM

## 2022-04-01 MED ORDER — AMLODIPINE BESYLATE 5 MG PO TABS: 7.5000 mg | ORAL_TABLET | Freq: Every day | ORAL | 5 refills | Status: DC

## 2022-04-01 NOTE — Assessment & Plan Note (Signed)
   chronic  taking Amlodipine 5mg  in the evenings & HCTZ 12.5mg  in am  BP slightly high today - pt reports normal readings at home  advised to take extra 1/2 pill of Amlodipine in am with HCTZ if BP still running high at home >135/90.  no refills needed  f/u in 6 mos

## 2022-04-01 NOTE — Assessment & Plan Note (Signed)
   chronic  taking Hydroxyzine prn, Wellbutrin qam  no refills needed today  f/u in 6 mos or prn

## 2022-04-01 NOTE — Patient Instructions (Signed)
It was very nice to see you today!    If BP at home is >130/90 more often than not, increase your Amlodipine dose to 1/2 pill in am with your HCTZ.  Continue taking 1 full Amlodipine in the evenings. Ideal readings is 110-120/60-80.  Remember to keep drinking at least 2 liters of water daily and eat a low sodium diet.  Have a great week!       PLEASE NOTE:  If you had any lab tests please let us know if you have not heard back within a few days. You may see your results on MyChart before we have a chance to review them but we will give you a call once they are reviewed by [place]. If we ordered any referrals today, please let us know if you have not heard from their office within the next week.

## 2022-04-11 ENCOUNTER — Encounter: Payer: Self-pay | Admitting: Nurse Practitioner

## 2022-04-11 ENCOUNTER — Ambulatory Visit (INDEPENDENT_AMBULATORY_CARE_PROVIDER_SITE_OTHER): Payer: BC Managed Care – PPO | Admitting: Nurse Practitioner

## 2022-04-11 VITALS — BP 118/83 | HR 76 | Ht 61.0 in | Wt 203.0 lb

## 2022-04-11 DIAGNOSIS — N921 Excessive and frequent menstruation with irregular cycle: Secondary | ICD-10-CM | POA: Diagnosis not present

## 2022-04-11 MED ORDER — TRANEXAMIC ACID 650 MG PO TABS: 1300.0000 mg | ORAL_TABLET | Freq: Three times a day (TID) | ORAL | 3 refills | Status: DC

## 2022-04-11 NOTE — Progress Notes (Signed)
   Acute Office Visit  Subjective:    Patient ID: Nicole Barber, female    DOB: Dec 04, 1970, 51 y.o.   MRN: [id number]   HPI 51 y.o. presents as new patient for menorrhagia with irregular cycle. Referred by Jeanie Sewer, NP. H/O of heavy menses since menarche at age 51. Had ablation around 2004. Bleeding lightened some but gradually increased over the years. Cycles range from 3-5 weeks, are very heavy first 3-5 days requiring changing of pads every 2 hours, clots present, total bleeding time of 10-14 days. BTL. H/O HTN. Normal pap history, most recent 08/2021. Mother, 2 maternal aunts and 1 maternal cousin with hysterectomies in their 38s and 23s for DUB. She is now starting to consider hysterectomy but wants to know other options.   Review of Systems  Constitutional: Negative.   Genitourinary:  Positive for menstrual problem.       Objective:    Physical Exam Constitutional:      Appearance: Normal appearance.  Genitourinary:    General: Normal vulva.     Vagina: Normal.     Cervix: Normal.     Uterus: Normal.      BP 118/83   Pulse 76   Ht 5\' 1"  (1.549 m)   Wt 203 lb (92.1 kg)   LMP 03/22/2022   BMI 38.36 kg/m  Wt Readings from Last 3 Encounters:  04/11/22 203 lb (92.1 kg)  04/01/22 205 lb 2 oz (93 kg)  11/27/21 204 lb 6 oz (92.7 kg)        Patient informed chaperone available to be present for breast and/or pelvic exam. Patient has requested no chaperone to be present. Patient has been advised what will be completed during breast and pelvic exam.   Assessment & Plan:   Problem List Items Addressed This Visit   None Visit Diagnoses     Menorrhagia with irregular cycle    -  Primary   Relevant Medications   tranexamic acid (LYSTEDA) 650 MG TABS tablet      Plan: Discussed options for bleeding management to include progestin-only contraceptions, Lysteda, and surgery. She would like to try Lysteda. Will take TID first 3-5 days of cycle.      Tamela Gammon DNP, 12:17 PM 04/11/2022

## 2022-04-12 DIAGNOSIS — Z23 Encounter for immunization: Secondary | ICD-10-CM | POA: Diagnosis not present

## 2022-05-31 ENCOUNTER — Encounter: Payer: Self-pay | Admitting: Family

## 2022-05-31 ENCOUNTER — Ambulatory Visit (INDEPENDENT_AMBULATORY_CARE_PROVIDER_SITE_OTHER): Payer: BC Managed Care – PPO | Admitting: Family

## 2022-05-31 VITALS — BP 124/85 | HR 70 | Temp 98.0°F | Ht 61.0 in | Wt 209.4 lb

## 2022-05-31 DIAGNOSIS — J0110 Acute frontal sinusitis, unspecified: Secondary | ICD-10-CM | POA: Diagnosis not present

## 2022-05-31 MED ORDER — AMOXICILLIN-POT CLAVULANATE 875-125 MG PO TABS: 1.0000 | ORAL_TABLET | Freq: Two times a day (BID) | ORAL | 0 refills | Status: DC

## 2022-05-31 NOTE — Progress Notes (Signed)
Patient ID: Nicole Barber, female    DOB: 05/03/1971, 51 y.o.   MRN: [id number]  Chief Complaint  Patient presents with   Cough    Pt c/o  Cough, nasal/chest congestion and runny nose for about 3 weeks. Has tried zyrtec daily , alkaseteza plus which did not help symptoms.     HPI: Sinus sx:  Pt c/o  Cough, nasal/chest congestion and runny nose for about 3 weeks. Has tried zyrtec daily , alka seltzer plus which did not help symptoms.  pt feels her sx started as allergy sx and then have worsened, feeling sinus pain, dark yellow, thick mucus. Reports steroid nasal sprays caused her nose to bleed in past.  Assessment & Plan:  1. Acute non-recurrent frontal sinusitis - sending Augmentin, advised on use & SE, increase water intake, should try a different steroid nasal spray and just use 1 squirt each side once a day, will provide some relief, but not cause nosebleed.   - amoxicillin-clavulanate (AUGMENTIN) 875-125 MG tablet; Take 1 tablet by mouth 2 (two) times daily after a meal.  Dispense: 14 tablet; Refill: 0  Problem List Items Addressed This Visit   None   Subjective:    Outpatient Medications Prior to Visit  Medication Sig Dispense Refill   amLODipine (NORVASC) 5 MG tablet Take 1.5 tablets (7.5 mg total) by mouth daily. Take 1 tab qpm, 1/2 tab in am with HCTZ 30 tablet 5   beclomethasone (QVAR) 80 MCG/ACT inhaler Inhale 2 puffs into the lungs 2 (two) times daily. 1 Inhaler 5   buPROPion ER (WELLBUTRIN SR) 100 MG 12 hr tablet Take 1 tablet (100 mg total) by mouth daily. 30 tablet 5   cetirizine (ZYRTEC) 10 MG tablet Take 10 mg by mouth daily.     hydrochlorothiazide (HYDRODIURIL) 12.5 MG tablet Take 1 tablet (12.5 mg total) by mouth every morning. 90 tablet 1   hydrOXYzine (ATARAX) 10 MG tablet Take 1-2 tablets (10-20 mg total) by mouth every evening. 45 tablet 5   ibuprofen (ADVIL) 800 MG tablet Take 800 mg by mouth every 6 (six) hours as needed.     tranexamic acid (LYSTEDA)  650 MG TABS tablet Take 2 tablets (1,300 mg total) by mouth 3 (three) times daily. 30 tablet 3   No facility-administered medications prior to visit.   Past Medical History:  Diagnosis Date   Asthma with acute exacerbation 06/22/2015   Elevated blood pressure reading 07/26/2021   Encounter for screening mammogram for breast cancer 07/26/2021   Headache 11/09/2014   Migraine issues   Other tenosynovitis of hand and wrist 09/15/2008   Qualifier: Diagnosis of  By: Karn Pickler MD, Jessica     Reactive airway disease 11/09/2014   Past Surgical History:  Procedure Laterality Date   ABLATION     TONSILLECTOMY     TUBAL LIGATION     No Known Allergies    Objective:    Physical Exam Vitals and nursing note reviewed.  Constitutional:      Appearance: Normal appearance. She is ill-appearing.     Interventions: Face mask in place.  HENT:     Right Ear: Tympanic membrane and ear canal normal.     Left Ear: Tympanic membrane and ear canal normal.     Nose:     Right Sinus: Frontal sinus tenderness present.     Left Sinus: Frontal sinus tenderness present.     Mouth/Throat:     Mouth: Mucous membranes are moist.  Pharynx: Posterior oropharyngeal erythema present. No pharyngeal swelling, oropharyngeal exudate or uvula swelling.     Tonsils: No tonsillar exudate or tonsillar abscesses.  Cardiovascular:     Rate and Rhythm: Normal rate and regular rhythm.  Pulmonary:     Effort: Pulmonary effort is normal.     Breath sounds: Normal breath sounds.  Musculoskeletal:        General: Normal range of motion.  Lymphadenopathy:     Head:     Right side of head: No preauricular or posterior auricular adenopathy.     Left side of head: No preauricular or posterior auricular adenopathy.     Cervical: No cervical adenopathy.  Skin:    General: Skin is warm and dry.  Neurological:     Mental Status: She is alert.  Psychiatric:        Mood and Affect: Mood normal.        Behavior: Behavior  normal.    BP 124/85 (BP Location: Left Arm, Patient Position: Sitting, Cuff Size: Large)   Pulse 70   Temp 98 F (36.7 C) (Temporal)   Ht 5\' 1"  (1.549 m)   Wt 209 lb 6 oz (95 kg)   SpO2 97%   BMI 39.56 kg/m  Wt Readings from Last 3 Encounters:  05/31/22 209 lb 6 oz (95 kg)  04/11/22 203 lb (92.1 kg)  04/01/22 205 lb 2 oz (93 kg)       06/01/22, NP

## 2022-06-04 ENCOUNTER — Encounter: Payer: Self-pay | Admitting: Family

## 2022-06-04 DIAGNOSIS — R053 Chronic cough: Secondary | ICD-10-CM

## 2022-06-04 DIAGNOSIS — J4540 Moderate persistent asthma, uncomplicated: Secondary | ICD-10-CM

## 2022-06-05 MED ORDER — PREDNISONE 20 MG PO TABS: ORAL_TABLET | ORAL | 0 refills | Status: DC

## 2022-06-05 MED ORDER — QVAR REDIHALER 80 MCG/ACT IN AERB: 2.0000 | INHALATION_SPRAY | Freq: Two times a day (BID) | RESPIRATORY_TRACT | 2 refills | Status: DC

## 2022-06-21 ENCOUNTER — Other Ambulatory Visit: Payer: Self-pay | Admitting: Family

## 2022-06-21 DIAGNOSIS — I10 Essential (primary) hypertension: Secondary | ICD-10-CM

## 2022-08-30 ENCOUNTER — Other Ambulatory Visit: Payer: Self-pay | Admitting: Family

## 2022-08-30 DIAGNOSIS — F411 Generalized anxiety disorder: Secondary | ICD-10-CM

## 2022-10-01 ENCOUNTER — Encounter: Payer: Self-pay | Admitting: Family

## 2022-10-01 ENCOUNTER — Ambulatory Visit (INDEPENDENT_AMBULATORY_CARE_PROVIDER_SITE_OTHER): Payer: BC Managed Care – PPO | Admitting: Family

## 2022-10-01 VITALS — BP 126/88 | HR 80 | Temp 98.1°F | Ht 61.0 in | Wt 216.2 lb

## 2022-10-01 DIAGNOSIS — I10 Essential (primary) hypertension: Secondary | ICD-10-CM

## 2022-10-01 DIAGNOSIS — F411 Generalized anxiety disorder: Secondary | ICD-10-CM

## 2022-10-01 LAB — LIPID PANEL
Cholesterol: 156 mg/dL (ref 0–200)
HDL: 41.4 mg/dL (ref >39.00)
LDL Cholesterol: 86 mg/dL (ref 0–99)
NonHDL: 114.99
Total CHOL/HDL Ratio: 4
Triglycerides: 143 mg/dL (ref 0.0–149.0)
VLDL: 28.6 mg/dL (ref 0.0–40.0)

## 2022-10-01 LAB — COMPREHENSIVE METABOLIC PANEL
ALT: 17 U/L (ref 0–35)
AST: 18 U/L (ref 0–37)
Albumin: 4.1 g/dL (ref 3.5–5.2)
Alkaline Phosphatase: 92 U/L (ref 39–117)
BUN: 11 mg/dL (ref 6–23)
CO2: 29 mEq/L (ref 19–32)
Calcium: 9.2 mg/dL (ref 8.4–10.5)
Chloride: 101 mEq/L (ref 96–112)
Creatinine, Ser: 0.75 mg/dL (ref 0.40–1.20)
GFR: 93.53 mL/min (ref >60.00)
Glucose, Bld: 97 mg/dL (ref 70–99)
Potassium: 4.1 mEq/L (ref 3.5–5.1)
Sodium: 137 mEq/L (ref 135–145)
Total Bilirubin: 0.5 mg/dL (ref 0.2–1.2)
Total Protein: 6.9 g/dL (ref 6.0–8.3)

## 2022-10-01 MED ORDER — BUPROPION HCL ER (SR) 100 MG PO TB12: ORAL_TABLET | ORAL | 5 refills | Status: DC

## 2022-10-01 NOTE — Assessment & Plan Note (Addendum)
chronic taking Amlodipine 5mg  in the evenings & HCTZ 12.5mg  in am BP good today, has gained weight rechecking labs today f/u in 6 mos

## 2022-10-01 NOTE — Assessment & Plan Note (Signed)
chronic taking Hydroxyzine prn, Wellbutrin qam refilled Wellbutrin f/u in 6 mos or prn

## 2022-10-01 NOTE — Progress Notes (Signed)
Patient ID: NEETI SHAWVER, female    DOB: Jun 03, 1971, 52 y.o.   MRN: [id number]  Chief Complaint  Patient presents with   Hypertension    W/ labs    HPI: Blood pressure problem: Patient is currently maintained on the following medications for blood pressure: Amlodipine 5mg  qhs, HCTZ 12.5mg  qam Pt reports a significant weight gain and increase in anxiety over last months to a year. Patient denies chest pain, has occasional headaches, no shortness of breath, mild swelling. Anxiety: Patient complains of anxiety disorder.   She has the following symptoms: fatigue, feelings of losing control, irritable, palpitations, racing thoughts, sweating, difficulty sleeping. Onset of symptoms was approximately  months ago, She denies current suicidal and homicidal ideation. Risk factors: negative life event cares full time for mother with dementia, also working, adult son with needs living in home. Previous treatment includes no previous evaluation, meds or counseling.  Pt started Hydroxyzine prn in the evenings and Bupropion 100mg  12h in am and reports sx are controlled.  Assessment & Plan:  Generalized anxiety disorder Assessment & Plan: chronic taking Hydroxyzine prn, Wellbutrin qam refilled Wellbutrin f/u in 6 mos or prn  Orders: -     buPROPion HCl ER (SR); TAKE 1 TABLET(100 MG) BY MOUTH DAILY  Dispense: 30 tablet; Refill: 5  Essential hypertension Assessment & Plan: chronic taking Amlodipine 5mg  in the evenings & HCTZ 12.5mg  in am BP good today, has gained weight rechecking labs today f/u in 6 mos  Orders: -     Lipid panel -     Comprehensive metabolic panel   Subjective:    Outpatient Medications Prior to Visit  Medication Sig Dispense Refill   amLODipine (NORVASC) 5 MG tablet TAKE 1 TABLET(5 MG) BY MOUTH DAILY 90 tablet 1   cetirizine (ZYRTEC) 10 MG tablet Take 10 mg by mouth daily.     hydrochlorothiazide (HYDRODIURIL) 12.5 MG tablet Take 1 tablet (12.5 mg total) by mouth  every morning. 90 tablet 1   hydrOXYzine (ATARAX) 10 MG tablet TAKE 1 TO 2 TABLETS(10 TO 20 MG) BY MOUTH EVERY EVENING 45 tablet 5   ibuprofen (ADVIL) 800 MG tablet Take 800 mg by mouth every 6 (six) hours as needed.     amoxicillin-clavulanate (AUGMENTIN) 875-125 MG tablet Take 1 tablet by mouth 2 (two) times daily after a meal. 14 tablet 0   beclomethasone (QVAR REDIHALER) 80 MCG/ACT inhaler Inhale 2 puffs into the lungs 2 (two) times daily. 1 each 2   buPROPion ER (WELLBUTRIN SR) 100 MG 12 hr tablet TAKE 1 TABLET(100 MG) BY MOUTH DAILY 30 tablet 5   predniSONE (DELTASONE) 20 MG tablet Take 2 pills in the morning with breakfast for 3 days, then 1 pill for 2 days 8 tablet 0   tranexamic acid (LYSTEDA) 650 MG TABS tablet Take 2 tablets (1,300 mg total) by mouth 3 (three) times daily. 30 tablet 3   No facility-administered medications prior to visit.   Past Medical History:  Diagnosis Date   Asthma with acute exacerbation 06/22/2015   Elevated blood pressure reading 07/26/2021   Encounter for screening mammogram for breast cancer 07/26/2021   Headache 11/09/2014   Migraine issues   Other tenosynovitis of hand and wrist 09/15/2008   Qualifier: Diagnosis of  By: Genene Churn MD, Jessica     Reactive airway disease 11/09/2014   Past Surgical History:  Procedure Laterality Date   ABLATION     TONSILLECTOMY     TUBAL LIGATION  No Known Allergies    Objective:    Physical Exam Vitals and nursing note reviewed.  Constitutional:      Appearance: Normal appearance. She is obese.  Cardiovascular:     Rate and Rhythm: Normal rate and regular rhythm.  Pulmonary:     Effort: Pulmonary effort is normal.     Breath sounds: Normal breath sounds.  Musculoskeletal:        General: Normal range of motion.  Skin:    General: Skin is warm and dry.  Neurological:     Mental Status: She is alert.  Psychiatric:        Mood and Affect: Mood normal.        Behavior: Behavior normal.    BP 126/88 (BP  Location: Left Arm, Patient Position: Sitting, Cuff Size: Large)   Pulse 80   Temp 98.1 F (36.7 C) (Temporal)   Ht 5\' 1"  (1.549 m)   Wt 216 lb 3.2 oz (98.1 kg)   SpO2 95%   BMI 40.85 kg/m  Wt Readings from Last 3 Encounters:  10/01/22 216 lb 3.2 oz (98.1 kg)  05/31/22 209 lb 6 oz (95 kg)  04/11/22 203 lb (92.1 kg)     Jeanie Sewer, NP

## 2022-12-27 ENCOUNTER — Other Ambulatory Visit: Payer: Self-pay | Admitting: Family

## 2022-12-27 DIAGNOSIS — I10 Essential (primary) hypertension: Secondary | ICD-10-CM

## 2023-01-24 ENCOUNTER — Other Ambulatory Visit: Payer: Self-pay | Admitting: Family

## 2023-01-24 DIAGNOSIS — I10 Essential (primary) hypertension: Secondary | ICD-10-CM

## 2023-02-23 ENCOUNTER — Other Ambulatory Visit: Payer: Self-pay | Admitting: Family

## 2023-02-23 DIAGNOSIS — I10 Essential (primary) hypertension: Secondary | ICD-10-CM

## 2023-03-23 ENCOUNTER — Other Ambulatory Visit: Payer: Self-pay | Admitting: Family

## 2023-03-23 DIAGNOSIS — I10 Essential (primary) hypertension: Secondary | ICD-10-CM

## 2023-04-22 ENCOUNTER — Other Ambulatory Visit: Payer: Self-pay | Admitting: Family

## 2023-04-22 DIAGNOSIS — I10 Essential (primary) hypertension: Secondary | ICD-10-CM

## 2023-05-24 ENCOUNTER — Other Ambulatory Visit: Payer: Self-pay | Admitting: Family

## 2023-05-24 DIAGNOSIS — I10 Essential (primary) hypertension: Secondary | ICD-10-CM

## 2023-05-30 ENCOUNTER — Other Ambulatory Visit: Payer: Self-pay | Admitting: Family

## 2023-05-30 DIAGNOSIS — F411 Generalized anxiety disorder: Secondary | ICD-10-CM

## 2023-06-05 ENCOUNTER — Encounter: Payer: Self-pay | Admitting: Family

## 2023-06-11 ENCOUNTER — Encounter: Payer: Self-pay | Admitting: Family

## 2023-06-11 ENCOUNTER — Ambulatory Visit (INDEPENDENT_AMBULATORY_CARE_PROVIDER_SITE_OTHER): Payer: BC Managed Care – PPO | Admitting: Family

## 2023-06-11 VITALS — BP 133/88 | HR 78 | Temp 98.0°F | Ht 61.0 in | Wt 216.2 lb

## 2023-06-11 DIAGNOSIS — Z1211 Encounter for screening for malignant neoplasm of colon: Secondary | ICD-10-CM

## 2023-06-11 DIAGNOSIS — F411 Generalized anxiety disorder: Secondary | ICD-10-CM | POA: Diagnosis not present

## 2023-06-11 DIAGNOSIS — Z23 Encounter for immunization: Secondary | ICD-10-CM | POA: Diagnosis not present

## 2023-06-11 DIAGNOSIS — I10 Essential (primary) hypertension: Secondary | ICD-10-CM

## 2023-06-11 MED ORDER — HYDROCHLOROTHIAZIDE 12.5 MG PO TABS: 12.5000 mg | ORAL_TABLET | Freq: Every morning | ORAL | 1 refills | Status: DC

## 2023-06-11 MED ORDER — BUPROPION HCL ER (SR) 100 MG PO TB12: ORAL_TABLET | ORAL | 5 refills | Status: DC

## 2023-06-11 MED ORDER — AMLODIPINE BESYLATE 5 MG PO TABS: 5.0000 mg | ORAL_TABLET | Freq: Every day | ORAL | 1 refills | Status: DC

## 2023-06-11 MED ORDER — HYDROXYZINE HCL 10 MG PO TABS: 10.0000 mg | ORAL_TABLET | Freq: Three times a day (TID) | ORAL | 5 refills | Status: DC | PRN

## 2023-06-11 NOTE — Progress Notes (Signed)
Patient ID: Nicole Barber, female    DOB: [birth year], 52 y.o.   MRN: [id number]  Chief Complaint  Patient presents with   Generalized Anxiety Disorder       Discussed the use of AI scribe software for clinical note transcription with the patient, who gave verbal consent to proceed.  History of Present Illness   The patient, with a history of hypertension, anxiety, and depression, presents for a follow-up visit. She has been under significant stress due to multiple family health crises. Her mother passed away in 01/29/2023, her husband underwent a second back surgery in May, and her daughter recently underwent a double lung transplant in October. The patient has been living in [place] for three months to support her daughter during her outpatient rehabilitation.  The patient has been taking hydroxyzine and Wellbutrin for anxiety and depression. She reports taking hydroxyzine once daily at bedtime and Wellbutrin once daily. She is unsure if the current dose of Wellbutrin is sufficient given her ongoing stressors. She has been taking hydrochlorothiazide and amlodipine for hypertension without interruption.     Assessment & Plan:     Hypertension - Stable on current regimen of hydrochlorothiazide and amlodipine. BP wnl today. -Continue current medications. -Refill hydrochlorothiazide and amlodipine prescriptions at [facility]. -F/U 6 mos with lab work  Anxiety/Depression - Patient experiencing significant life stressors including recent family health issues. Currently on hydroxyzine and Wellbutrin. -Increase Wellbutrin to 200mg  daily or 100mg  bid for increased stress. -Continue hydroxyzine as needed for anxiety, can be taken during the day if not causing drowsiness. -Refill hydroxyzine and Wellbutrin prescriptions at [facility]. -F/U 6mos.  Colonoscopy - Patient has not had a colonoscopy and recently turned 52. -Send referral for colonoscopy to Gastroenterology.  General Health Maintenance  - -Administered flu shot today. -Plan for labs at next visit in 6 months.     Subjective:    Outpatient Medications Prior to Visit  Medication Sig Dispense Refill   amLODipine (NORVASC) 5 MG tablet TAKE 1 TABLET(5 MG) BY MOUTH DAILY 30 tablet 0   buPROPion ER (WELLBUTRIN SR) 100 MG 12 hr tablet TAKE 1 TABLET(100 MG) BY MOUTH DAILY 30 tablet 5   cetirizine (ZYRTEC) 10 MG tablet Take 10 mg by mouth daily.     hydrochlorothiazide (HYDRODIURIL) 12.5 MG tablet Take 1 tablet (12.5 mg total) by mouth every morning. 90 tablet 1   hydrOXYzine (ATARAX) 10 MG tablet TAKE 1 TO 2 TABLETS(10 TO 20 MG) BY MOUTH EVERY EVENING 45 tablet 5   ibuprofen (ADVIL) 800 MG tablet Take 800 mg by mouth every 6 (six) hours as needed.     No facility-administered medications prior to visit.   Past Medical History:  Diagnosis Date   Asthma with acute exacerbation 06/22/2015   Elevated blood pressure reading 07/26/2021   Encounter for screening mammogram for breast cancer 07/26/2021   Headache 11/09/2014   Migraine issues   Other tenosynovitis of hand and wrist 09/15/2008   Qualifier: Diagnosis of  By: Karn Pickler MD, Jessica     Reactive airway disease 11/09/2014   Past Surgical History:  Procedure Laterality Date   ABLATION     TONSILLECTOMY     TUBAL LIGATION     No Known Allergies    Objective:    Physical Exam Vitals and nursing note reviewed.  Constitutional:      Appearance: Normal appearance.  Cardiovascular:     Rate and Rhythm: Normal rate and regular rhythm.  Pulmonary:     Effort:  Pulmonary effort is normal.     Breath sounds: Normal breath sounds.  Musculoskeletal:        General: Normal range of motion.  Skin:    General: Skin is warm and dry.  Neurological:     Mental Status: She is alert.  Psychiatric:        Mood and Affect: Mood normal.        Behavior: Behavior normal.    BP 133/88 (BP Location: Left Arm, Patient Position: Sitting, Cuff Size: Large)   Pulse 78   Temp 98 F (36.7  C) (Temporal)   Ht 5\' 1"  (1.549 m)   Wt 216 lb 3.2 oz (98.1 kg)   SpO2 98%   BMI 40.85 kg/m  Wt Readings from Last 3 Encounters:  06/11/23 216 lb 3.2 oz (98.1 kg)  10/01/22 216 lb 3.2 oz (98.1 kg)  05/31/22 209 lb 6 oz (95 kg)      Dulce Sellar, NP

## 2023-06-11 NOTE — Assessment & Plan Note (Signed)
chronic, Unstable Patient experiencing significant life stressors including recent family health issues. Currently on hydroxyzine and Wellbutrin. -Increase Wellbutrin to 200mg  daily or 100mg  bid for increased stress. -Continue hydroxyzine as needed for anxiety, can be taken during the day if not causing drowsiness. -Refill hydroxyzine and Wellbutrin prescriptions at [facility]. -F/U 6mos.

## 2023-06-11 NOTE — Assessment & Plan Note (Signed)
chronic, stable taking Amlodipine 5mg  in the evenings & HCTZ 12.5mg  in am BP good today sending refills f/u in 6 mos w/labs

## 2023-07-04 ENCOUNTER — Other Ambulatory Visit: Payer: Self-pay | Admitting: Family

## 2023-07-04 DIAGNOSIS — I10 Essential (primary) hypertension: Secondary | ICD-10-CM

## 2023-07-23 ENCOUNTER — Other Ambulatory Visit: Payer: Self-pay

## 2023-07-23 ENCOUNTER — Ambulatory Visit
Admission: EM | Admit: 2023-07-23 | Discharge: 2023-07-23 | Disposition: A | Discharge status: Home / Self Care | Payer: BC Managed Care – PPO | Attending: Nurse Practitioner | Admitting: Nurse Practitioner

## 2023-07-23 ENCOUNTER — Encounter: Payer: Self-pay | Admitting: Emergency Medicine

## 2023-07-23 DIAGNOSIS — H1033 Unspecified acute conjunctivitis, bilateral: Secondary | ICD-10-CM | POA: Diagnosis not present

## 2023-07-23 MED ORDER — ERYTHROMYCIN 5 MG/GM OP OINT: TOPICAL_OINTMENT | Freq: Four times a day (QID) | OPHTHALMIC | 0 refills | Status: AC

## 2023-07-23 NOTE — Discharge Instructions (Signed)
Use erythromycin ointment as prescribed to treat for bacterial conjunctivitis or pinkeye.  Wash your hands frequently to prevent spread of pinkeye.  Throw out old contacts and any eye make-up used after symptoms began.  You can use warm compresses to help with drainage and eye mucus.  Seek care if symptoms do not improve with treatment.

## 2023-07-23 NOTE — ED Triage Notes (Signed)
Pt reports bilateral eye redness x3 days, bilateral eye drainage since this am. Reports wears contacts usually during the day but sense symptoms started as been wearing glasses.

## 2023-07-23 NOTE — ED Provider Notes (Signed)
[facility]    CSN: [id number] Arrival date & time: 07/23/23  0909      History   Chief Complaint Chief Complaint  Patient presents with   Eye Drainage    HPI Nicole Barber is a 53 y.o. female.   Patient presents today for 3-day history of bilateral eye itching, redness, and thick mucus coming from her eyes.  Reports symptoms initially began in right eye and have since also moved to the left eye.  No foreign body sensation of the eye, eye pain headache, floaters in the vision, blurred or double vision.  She has been wearing her glasses and she is not used to wearing them.  Reports symptoms began while wearing contacts and she is 8 remove the contacts and not reinserted them.  Reports that she has daily contact lenses and changes them every day as she is supposed to.  No recent sick contacts.  She does keep her 64-year-old granddaughter today care, daycare denies any recent outbreaks of pinkeye to the patient.  Patient denies recent fever, cough, congestion, sore throat.  She does take oral cetirizine.    Past Medical History:  Diagnosis Date   Asthma with acute exacerbation 06/22/2015   Elevated blood pressure reading 07/26/2021   Encounter for screening mammogram for breast cancer 07/26/2021   Headache 11/09/2014   Migraine issues   Other tenosynovitis of hand and wrist 09/15/2008   Qualifier: Diagnosis of  By: Karn Pickler MD, Emely Fahy     Reactive airway disease 11/09/2014    Patient Active Problem List   Diagnosis Date Noted   Essential hypertension 08/09/2021   Generalized anxiety disorder 07/26/2021   Moderate persistent asthma 11/01/2015   Perennial and seasonal allergic rhinitis 11/01/2015   GERD (gastroesophageal reflux disease) 11/01/2015    Past Surgical History:  Procedure Laterality Date   ABLATION     TONSILLECTOMY     TUBAL LIGATION      OB History     Gravida  6   Para  3   Term  3   Preterm      AB      Living  3      SAB       IAB      Ectopic      Multiple      Live Births               Home Medications    Prior to Admission medications   Medication Sig Start Date End Date Taking? Authorizing Provider  erythromycin ophthalmic ointment Place into both eyes 4 (four) times daily for 7 days. Place a 1/2 inch ribbon of ointment into the lower eyelid. 07/23/23 07/30/23 Yes Cathlean Marseilles A, NP  amLODipine (NORVASC) 5 MG tablet TAKE 1 TABLET(5 MG) BY MOUTH DAILY 07/04/23   Dulce Sellar, NP  buPROPion ER (WELLBUTRIN SR) 100 MG 12 hr tablet TAKE 1 TABLET(100 MG) BY MOUTH DAILY 06/11/23   Dulce Sellar, NP  cetirizine (ZYRTEC) 10 MG tablet Take 10 mg by mouth daily.    [provider]  hydrochlorothiazide (HYDRODIURIL) 12.5 MG tablet Take 1 tablet (12.5 mg total) by mouth every morning. 06/11/23   Dulce Sellar, NP  hydrOXYzine (ATARAX) 10 MG tablet Take 1 tablet (10 mg total) by mouth 3 (three) times daily as needed for anxiety. 06/11/23   Dulce Sellar, NP  ibuprofen (ADVIL) 800 MG tablet Take 800 mg by mouth every 6 (six) hours as needed. 08/23/21   [provider]    Family History Family History  Problem Relation Age of Onset   Breast cancer Mother 77   Asthma Mother    Diabetes Father    Heart disease Father    Allergic rhinitis Brother     Social History Social History   Tobacco Use   Smoking status: Never    Passive exposure: Current (husband is smoker)   Smokeless tobacco: Never  Vaping Use   Vaping status: Never Used  Substance Use Topics   Alcohol use: Not Currently    Alcohol/week: 1.0 standard drink of alcohol    Types: 1 Glasses of wine per week   Drug use: No     Allergies   Patient has no known allergies.   Review of Systems Review of Systems Per HPI  Physical Exam Triage Vital Signs ED Triage Vitals  Encounter Vitals Group     BP 07/23/23 0918 (!) 145/86     Systolic BP Percentile --      Diastolic BP Percentile --      Pulse  Rate 07/23/23 0918 91     Resp 07/23/23 0918 20     Temp 07/23/23 0918 98.4 F (36.9 C)     Temp Source 07/23/23 0918 Oral     SpO2 07/23/23 0918 94 %     Weight --      Height --      Head Circumference --      Peak Flow --      Pain Score 07/23/23 0920 0     Pain Loc --      Pain Education --      Exclude from Growth Chart --    No data found.  Updated Vital Signs BP (!) 145/86 (BP Location: Right Arm)   Pulse 91   Temp 98.4 F (36.9 C) (Oral)   Resp 20   LMP 07/23/2023 (Approximate)   SpO2 94%   Visual Acuity Right Eye Distance: 20/20 Left Eye Distance: 20/20 Bilateral Distance: 20/20 (reprots unable to see without corrective lenses at baseline. pt wearing glasses during visual acuity.)  Right Eye Near:   Left Eye Near:    Bilateral Near:     Physical Exam Vitals and nursing note reviewed.  Constitutional:      General: She is not in acute distress.    Appearance: Normal appearance. She is not toxic-appearing.  HENT:     Head: Normocephalic and atraumatic.     Right Ear: External ear normal.     Left Ear: External ear normal.     Nose: Nose normal. No congestion or rhinorrhea.     Mouth/Throat:     Mouth: Mucous membranes are moist.     Pharynx: Oropharynx is clear.  Eyes:     General:        Right eye: Discharge present.        Left eye: Discharge present.    Extraocular Movements: Extraocular movements intact.     Conjunctiva/sclera:     Right eye: Right conjunctiva is injected.     Left eye: Left conjunctiva is injected.     Pupils: Pupils are equal, round, and reactive to light.     Comments: Dried drainage noted to upper and lower lids bilaterally.  No surrounding erythema, periorbital edema, or eyelid edema.  Pulmonary:     Effort: Pulmonary effort is normal. No respiratory distress.  Musculoskeletal:     Cervical back: Normal range of motion.  Lymphadenopathy:  Cervical: No cervical adenopathy.  Skin:    General: Skin is warm and dry.      Capillary Refill: Capillary refill takes less than 2 seconds.     Coloration: Skin is not jaundiced or pale.     Findings: No erythema.  Neurological:     Mental Status: She is alert and oriented to person, place, and time.  Psychiatric:        Behavior: Behavior is cooperative.      UC Treatments / Results  Labs (all labs ordered are listed, but only abnormal results are displayed) Labs Reviewed - No data to display  EKG   Radiology No results found.  Procedures Procedures (including critical care time)  Medications Ordered in UC Medications - No data to display  Initial Impression / Assessment and Plan / UC Course  I have reviewed the triage vital signs and the nursing notes.  Pertinent labs & imaging results that were available during my care of the patient were reviewed by me and considered in my medical decision making (see chart for details).   Patient is well-appearing, normotensive, afebrile, not tachycardic, not tachypneic, oxygenating well on room air.    1. Acute bacterial conjunctivitis of both eyes Highly suspicious for bacterial conjunctivitis Treat with erythromycin ointment 4 times daily for 7 days Supportive care discussed and hand hygiene discussed Return precautions discussed  The patient was given the opportunity to ask questions.  All questions answered to their satisfaction.  The patient is in agreement to this plan.    Final Clinical Impressions(s) / UC Diagnoses   Final diagnoses:  Acute bacterial conjunctivitis of both eyes     Discharge Instructions      Use erythromycin ointment as prescribed to treat for bacterial conjunctivitis or pinkeye.  Wash your hands frequently to prevent spread of pinkeye.  Throw out old contacts and any eye make-up used after symptoms began.  You can use warm compresses to help with drainage and eye mucus.  Seek care if symptoms do not improve with treatment.   ED Prescriptions     Medication Sig Dispense  Auth. Provider   erythromycin ophthalmic ointment Place into both eyes 4 (four) times daily for 7 days. Place a 1/2 inch ribbon of ointment into the lower eyelid. 3.5 g Valentino Nose, NP      PDMP not reviewed this encounter.   Valentino Nose, NP 07/23/23 [id number]

## 2023-09-03 ENCOUNTER — Encounter: Payer: Self-pay | Admitting: Family

## 2023-09-04 ENCOUNTER — Other Ambulatory Visit: Payer: Self-pay | Admitting: Family

## 2023-09-04 DIAGNOSIS — Z0000 Encounter for general adult medical examination without abnormal findings: Secondary | ICD-10-CM

## 2023-09-12 ENCOUNTER — Ambulatory Visit
Admission: RE | Admit: 2023-09-12 | Discharge: 2023-09-12 | Disposition: A | Discharge status: Home / Self Care | Source: Ambulatory Visit | Attending: Family | Admitting: Family

## 2023-09-12 DIAGNOSIS — Z0000 Encounter for general adult medical examination without abnormal findings: Secondary | ICD-10-CM

## 2023-09-12 DIAGNOSIS — Z1231 Encounter for screening mammogram for malignant neoplasm of breast: Secondary | ICD-10-CM | POA: Diagnosis not present

## 2023-09-16 ENCOUNTER — Encounter: Payer: Self-pay | Admitting: Family

## 2023-09-16 ENCOUNTER — Encounter (INDEPENDENT_AMBULATORY_CARE_PROVIDER_SITE_OTHER): Payer: Self-pay

## 2023-09-16 ENCOUNTER — Ambulatory Visit (INDEPENDENT_AMBULATORY_CARE_PROVIDER_SITE_OTHER): Admitting: Family

## 2023-09-16 DIAGNOSIS — N951 Menopausal and female climacteric states: Secondary | ICD-10-CM

## 2023-09-16 NOTE — Progress Notes (Signed)
 Patient ID: Nicole Barber, female    DOB: 05-Dec-1970, 53 y.o.   MRN: [id number]  Chief Complaint  Patient presents with   Menopause    Pt states past couple months skin is dry itching, night sweats, hot flashes comes and goes, not sleeping good,sex drive not good. Also can not lose weight but has been really trying since October. Had period December then another one now In March.        Discussed the use of AI scribe software for clinical note transcription with the patient, who gave verbal consent to proceed.  History of Present Illness   The patient, with a history of depression and anxiety, presents with menopausal symptoms, weight gain, dry and itchy skin, and fatigue. The patient's last menstrual cycle was in March, after skipping January and February. The patient reports feeling tired all the time, despite getting rest. The patient also experiences night sweats and occasional sleep disturbances. The patient has been trying to lose weight by cooking at home and eating healthier, including cutting out sodas and eating more fruits. However, the patient has not seen any weight loss, which is causing frustration. The patient's skin is constantly dry and itchy, regardless of what is applied to it. The patient is currently taking bupropion once a day for mood regulation, but it does not seem to help with libido. The patient also mentions a family history of breast cancer, with her mother being diagnosed at the age of 32.     Assessment & Plan:     Perimenopausal Symptoms - Experiencing symptoms consistent with perimenopause. Hormonal treatment considered to manage symptoms. Discussed benefits and risks, including increased risk for blood clots, CV risk and breast cancer. Due to her obesity, HTN and age, would prefer more detailed discussion with her GYN. -Advised on OTC herbal remedies including Black Cohosh or Estroven to help with hot flashes/night sweats, mood. -Advise pt to call her GYN,  number provided, any concerns can send new referral.  Morbid obesity - Difficulty with weight loss despite lifestyle changes. Discussed dietary timing and composition. Consideration for weight loss medications if insurance covers and comorbidity is present. - Refer to weight loss clinic or nutritionist for personalized dietary plan. - Advise on checking insurance coverage for weight loss medications.  Dry Skin - Persistent dry skin likely related to aging and perimenopausal changes. - Recommend moisturizing body washes and creams, possibly twice daily. - Advise on trying lactic acid-based creams for additional moisture for very dry skin areas like feet, elbows, etc. -F/U prn    Subjective:    Outpatient Medications Prior to Visit  Medication Sig Dispense Refill   amLODipine (NORVASC) 5 MG tablet TAKE 1 TABLET(5 MG) BY MOUTH DAILY 90 tablet 0   buPROPion ER (WELLBUTRIN SR) 100 MG 12 hr tablet TAKE 1 TABLET(100 MG) BY MOUTH DAILY 60 tablet 5   cetirizine (ZYRTEC) 10 MG tablet Take 10 mg by mouth daily.     hydrochlorothiazide (HYDRODIURIL) 12.5 MG tablet Take 1 tablet (12.5 mg total) by mouth every morning. 90 tablet 1   hydrOXYzine (ATARAX) 10 MG tablet Take 1 tablet (10 mg total) by mouth 3 (three) times daily as needed for anxiety. 45 tablet 5   ibuprofen (ADVIL) 800 MG tablet Take 800 mg by mouth every 6 (six) hours as needed.     No facility-administered medications prior to visit.   Past Medical History:  Diagnosis Date   Asthma with acute exacerbation 06/22/2015   Elevated blood  pressure reading 07/26/2021   Encounter for screening mammogram for breast cancer 07/26/2021   Headache 11/09/2014   Migraine issues   Other tenosynovitis of hand and wrist 09/15/2008   Qualifier: Diagnosis of  By: Karn Pickler MD, Jessica     Reactive airway disease 11/09/2014   Past Surgical History:  Procedure Laterality Date   ABLATION     TONSILLECTOMY     TUBAL LIGATION     No Known Allergies     Objective:    Physical Exam Vitals and nursing note reviewed.  Constitutional:      Appearance: Normal appearance. She is morbidly obese.  Cardiovascular:     Rate and Rhythm: Normal rate and regular rhythm.  Pulmonary:     Effort: Pulmonary effort is normal.     Breath sounds: Normal breath sounds.  Musculoskeletal:        General: Normal range of motion.  Skin:    General: Skin is warm and dry.  Neurological:     Mental Status: She is alert.  Psychiatric:        Mood and Affect: Mood normal.        Behavior: Behavior normal.    BP 130/84   Pulse 81   Temp 98.1 F (36.7 C) (Temporal)   Ht 5\' 1"  (1.549 m)   Wt 219 lb 9.6 oz (99.6 kg)   SpO2 98%   BMI 41.49 kg/m  Wt Readings from Last 3 Encounters:  09/16/23 219 lb 9.6 oz (99.6 kg)  06/11/23 216 lb 3.2 oz (98.1 kg)  10/01/22 216 lb 3.2 oz (98.1 kg)      Dulce Sellar, NP

## 2023-10-29 ENCOUNTER — Encounter (INDEPENDENT_AMBULATORY_CARE_PROVIDER_SITE_OTHER): Payer: Self-pay | Admitting: Family Medicine

## 2023-10-29 ENCOUNTER — Ambulatory Visit (INDEPENDENT_AMBULATORY_CARE_PROVIDER_SITE_OTHER): Admitting: Family Medicine

## 2023-10-29 VITALS — BP 138/87 | HR 80 | Temp 98.7°F | Ht 61.0 in | Wt 215.0 lb

## 2023-10-29 DIAGNOSIS — Z6841 Body Mass Index (BMI) 40.0 and over, adult: Secondary | ICD-10-CM | POA: Diagnosis not present

## 2023-10-29 DIAGNOSIS — K219 Gastro-esophageal reflux disease without esophagitis: Secondary | ICD-10-CM

## 2023-10-29 DIAGNOSIS — I10 Essential (primary) hypertension: Secondary | ICD-10-CM | POA: Diagnosis not present

## 2023-10-29 DIAGNOSIS — Z0289 Encounter for other administrative examinations: Secondary | ICD-10-CM

## 2023-10-29 NOTE — Progress Notes (Signed)
 Rae Bugler, DO, ABFM, ABOM Bariatric physician [address] [place], [place], [place] [address] Office: [phone]  /  Fax: [phone]     Initial Evaluation:  Nicole Barber was seen in clinic today to evaluate for obesity. She is interested in losing weight to improve overall health and reduce the risk of weight related complications. She presents today to review program treatment options, initial physical assessment, and evaluation.      She was referred by: PCP  When asked how has your weight affected you? 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  Some associated conditions: Hypertension, GERD, and Lung disease  Current nutrition plan: Portion control / smart choices and Other: rarely eating out  Current level of physical activity: Walking 20-30 minutes daily   Current or previous pharmacotherapy: None  Response to medication: Never tried medications  Past Medical History:  Diagnosis Date   Asthma with acute exacerbation 06/22/2015   Elevated blood pressure reading 07/26/2021   Encounter for screening mammogram for breast cancer 07/26/2021   Headache 11/09/2014   Migraine issues   Other tenosynovitis of hand and wrist 09/15/2008   Qualifier: Diagnosis of  By: Chanetta Comes MD, Jessica     Reactive airway disease 11/09/2014    Current Outpatient Medications  Medication Instructions   amLODipine  (NORVASC ) 5 MG tablet TAKE 1 TABLET(5 MG) BY MOUTH DAILY   buPROPion  ER (WELLBUTRIN  SR) 100 MG 12 hr tablet TAKE 1 TABLET(100 MG) BY MOUTH DAILY   cetirizine (ZYRTEC) 10 mg, Daily   hydrochlorothiazide  (HYDRODIURIL ) 12.5 mg, Oral, Every morning   hydrOXYzine  (ATARAX ) 10 mg, Oral, 3 times daily PRN   ibuprofen (ADVIL) 800 mg, Every 6 hours PRN    No Known Allergies    Past Surgical History:  Procedure Laterality Date   ABLATION     TONSILLECTOMY     TUBAL LIGATION       Family History  Problem Relation Age of Onset   Breast cancer Mother 42   Asthma Mother    Diabetes Father    Heart disease Father    Allergic rhinitis Brother      Objective:  BP 138/87   Pulse 80   Temp 98.7 F (37.1 C)   Ht 5\' 1"  (1.549 m)   Wt 215 lb (97.5 kg)   SpO2 98%   BMI 40.62 kg/m  She was weighed on the bioimpedance scale: Body mass index is 40.62 kg/m.  Visceral Fat %: 13, Body Fat %: 44.3  Weight Lost Since Last Visit: N/a  Weight Gained Since Last Visit: N/a    Vitals Temp: 98.7 F (37.1 C) BP: 138/87 Pulse Rate: 80 SpO2: 98 %   Anthropometric Measurements Height: 5\' 1"  (1.549 m) Weight: 215 lb (97.5 kg) BMI (Calculated): 40.64 Weight at Last Visit: N/a Weight Lost Since Last Visit: N/a Weight Gained Since Last Visit: N/a Starting Weight: 215lb Total Weight Loss (lbs): 0 lb (0 kg) Waist Measurement : 46 inches   Body Composition  Body Fat %: 44.3 % Fat Mass (lbs): 95.2 lbs Muscle Mass (lbs): 113.8 lbs Total Body Water (lbs): 82.6 lbs Visceral Fat Rating : 13   Other Clinical Data Fasting: No Labs: No Today's Visit #: 0 Comments: Info session     General: Well Developed, well nourished, and in no  acute distress.  HEENT: Normocephalic, atraumatic; EOMI, sclerae are anicteric. Skin: Warm and dry, good turgor Chest:  Normal excursion, shape, no gross ABN Respiratory: No conversational dyspnea; speaking in full sentences NeuroM-Sk:  Normal gross ROM * 4 extremities  Psych: A and O *3, insight adequate, mood- full    Assessment and Plan:   FOR THE DISEASE OF OBESITY:  Morbid obesity (HCC) BMI 40.0-44.9, adult (HCC) - 40.64 Assessment & Plan: Reviewed PCP note from 09/16/2023. We reviewed anthropometrics, biometrics, associated medical conditions and contributing factors with patient. Nicole Barber would benefit from a  medically tailored reduced calorie nutrional plan based on his REE (resting energy expenditure), which will be determined by indirect calorimetry.  We will also assess for cardiometabolic risk and nutritional derangements via fasting labs at intake appointment.    Obesity Treatment / Action Plan:   she was weighed on the bioimpedance scale and results were discussed and documented in the synopsis.   Nicole Barber will complete provided nutritional and psychosocial assessment questionnaire before the next appointment.  she will be scheduled for indirect calorimetry to determine resting energy expenditure in a fasting state.  This will allow us  to create a reduced calorie, high-protein meal plan to promote loss of fat mass while preserving muscle mass.  We will also assess for cardiometabolic risk and nutritional derangements via an ECG and fasting serologies at her next appointment.  she was encouraged to work on amassing support from family and friends to begin their weight loss journey.   Work on eliminating or reducing the presence of highly processed, poorly nutritious, calorie-dense foods in the home.   Obesity Education Performed Today:  Patient was counseled on nutritional approaches to weight loss and benefits of reducing processed foods and consuming plant-based foods and high quality protein as part of nutritional weight management program.   We discussed the importance of long term lifestyle changes which include nutrition, exercise and behavioral modifications as well as the importance of customizing this to her specific health and social needs.   We discussed the benefits of reaching a healthier weight to alleviate the symptoms of existing conditions and reduce the risks of the biomechanical, metabolic and psychological effects of obesity.  Was counseled on the health benefits of losing 5%-10% of total body weight.  Was counseled on our cognitive behavorial therapy program,  lead by our bariatric psychologist, who focuses on emotional eating and creating positive behavorial change.  Was counseled on bariatric pharmacotherapy and how this may be used as an adjunct in their weight management    Nicole Barber appears to be in the action stage of change and states they are ready to start intensive lifestyle modifications and behavioral modifications.  It was recommended that she follow up in the next 1-2 weeks to review the above steps, and to continue with treatment of their chronic disease state of obesity   FOR OTHER CONDITIONS RELATED TO THE DISEASE OF OBESITY:  Essential hypertension Assessment & Plan: BP Readings from Last 3 Encounters:  10/29/23 138/87  09/16/23 130/84  07/23/23 (!) 145/86  Nicole Barber was diagnosed with this condition about 5 years ago. She is taking Hctz 12.5 mg and Norvasc  5 mg . BP is slightly above goal today, she reports having white coat hypertension. Pt is checking levels twice daily at home -- 117-120/80. Condition well controlled at home. Nicole Barber would benefit from weight loss and diet changes in order to improve this condition.   Gastroesophageal reflux disease, unspecified whether esophagitis present Assessment &  Plan: Nicole Barber does not take any prescribed medication for this condition. However, she takes Pepcid or Alka Seltzer as needed to manage symptoms. Pt endorses identifying her triggers and controlling this condition by dietary avoidance of trigger foods. Condition essentially well controlled. If pt decides to join program will stress importance of lifestyle modifications and diet changes to improve this condition.   Attestations:   I, Camryn Mix, acting as a Stage manager for [employer], DO., have compiled all relevant documentation for today's office visit on behalf of Nicole Sensor, DO, while in the presence of [employer], DO.  Reviewed by clinician on day of visit: allergies, medications, problem list, medical history,  surgical history, family history, social history, and previous encounter notes pertinent to obesity diagnosis.    I have spent 41 minutes in the care of the patient today. Specifically:  3 minutes was spent before the visit reviewing the chart. 36 minutes was spent counseling patient on the disease of obesity and what our program can do for their medical conditions as well as in preventing future diseases. I discussed the importance of comprehensive care in the treatment of obesity including mental well being and physical activity. This was all in addition to the above counseling. 2 minutes was spent after the visit on additional documentation and chart review.    I have reviewed the above documentation for accuracy and completeness, and I agree with the above. Rae Bugler, D.O.  The 21st Century Cures Act was signed into law in 2016 which includes the topic of electronic health records.  This provides immediate access to information in MyChart.  This includes consultation notes, operative notes, office notes, lab results and pathology reports.  If you have any questions about what you read please let us  know at your next visit so we can discuss your concerns and take corrective action if need be.  We are right here with you!

## 2023-12-03 ENCOUNTER — Encounter (INDEPENDENT_AMBULATORY_CARE_PROVIDER_SITE_OTHER): Payer: Self-pay

## 2023-12-07 ENCOUNTER — Other Ambulatory Visit: Payer: Self-pay | Admitting: Family

## 2023-12-07 DIAGNOSIS — I10 Essential (primary) hypertension: Secondary | ICD-10-CM

## 2023-12-17 ENCOUNTER — Ambulatory Visit (INDEPENDENT_AMBULATORY_CARE_PROVIDER_SITE_OTHER): Admitting: Family

## 2023-12-17 VITALS — BP 126/88 | HR 90 | Temp 98.2°F | Ht 61.0 in | Wt 221.8 lb

## 2023-12-17 DIAGNOSIS — Z23 Encounter for immunization: Secondary | ICD-10-CM | POA: Diagnosis not present

## 2023-12-17 DIAGNOSIS — F411 Generalized anxiety disorder: Secondary | ICD-10-CM

## 2023-12-17 DIAGNOSIS — N951 Menopausal and female climacteric states: Secondary | ICD-10-CM | POA: Insufficient documentation

## 2023-12-17 DIAGNOSIS — I10 Essential (primary) hypertension: Secondary | ICD-10-CM | POA: Diagnosis not present

## 2023-12-17 LAB — BASIC METABOLIC PANEL WITH GFR
BUN: 11 mg/dL (ref 6–23)
CO2: 31 meq/L (ref 19–32)
Calcium: 9.5 mg/dL (ref 8.4–10.5)
Chloride: 100 meq/L (ref 96–112)
Creatinine, Ser: 0.81 mg/dL (ref 0.40–1.20)
GFR: 84.56 mL/min (ref >60.00)
Glucose, Bld: 100 mg/dL — ABNORMAL HIGH (ref 70–99)
Potassium: 4 meq/L (ref 3.5–5.1)
Sodium: 136 meq/L (ref 135–145)

## 2023-12-17 MED ORDER — AMLODIPINE BESYLATE 5 MG PO TABS: 5.0000 mg | ORAL_TABLET | Freq: Every day | ORAL | 1 refills | Status: DC

## 2023-12-17 MED ORDER — BUPROPION HCL ER (SR) 100 MG PO TB12: ORAL_TABLET | ORAL | 1 refills | Status: DC

## 2023-12-17 NOTE — Assessment & Plan Note (Signed)
 Scheduling difficulties with [facility], on cancellation list. - Encourage remaining on cancellation list and continue scheduling efforts. - Offer referral to another GYN if difficulties persist.

## 2023-12-17 NOTE — Assessment & Plan Note (Signed)
 Engaged with [facility], seen for initial visit & awaiting official first visit in Sept.. - Encourage remaining on cancellation list for earlier appointment.

## 2023-12-17 NOTE — Assessment & Plan Note (Signed)
 Currently on bupropion  SR 100mg  bid, nearing need for refill. Denies any concerns or SE. - Send refill for bupropion . - F/U in 6 mos

## 2023-12-17 NOTE — Assessment & Plan Note (Signed)
 Hypertension well-controlled with amlodipine  5mg  qd and hydrochlorothiazide  12.5mg  qam. - Continue amlodipine  and hydrochlorothiazide , sending refill of Amlodipine  - Order BMP today to monitor electrolytes. - F/U in 6 mos

## 2023-12-17 NOTE — Progress Notes (Signed)
 Patient ID: Nicole Barber, female    DOB: [birth year], 53 y.o.   MRN: [id number]  Chief Complaint  Patient presents with   Obesity    Still having menopause symptoms, on cancellation list for OB to get appointment.   Discussed the use of AI scribe software for clinical note transcription with the patient, who gave verbal consent to proceed.  History of Present Illness Nicole Barber is a 53 year old female with hypertension who presents for medication management and follow-up.  She is on amlodipine  and hydrochlorothiazide  for hypertension, taking hydrochlorothiazide  in the morning, and is compliant with her regimen. She has no current symptoms related to hypertension. She is on bupropion  for depression but has not refilled it since December and is nearing the need for a refill. She is actively participating in a weight management program at [facility], with her first consult in September and a follow-up visit scheduled, including labs and tests. She is on a cancellation list for an earlier appointment.  Assessment & Plan Hypertension Hypertension well-controlled with amlodipine  5mg  qd and hydrochlorothiazide  12.5mg  qam. - Continue amlodipine  and hydrochlorothiazide , sending refill of Amlodipine  - Order BMP today to monitor electrolytes. - F/U in 6 mos  Anxiety & Depression Currently on bupropion  SR 100mg  bid, nearing need for refill. Denies any concerns or SE. - Send refill for bupropion . - F/U in 6 mos  Obesity Engaged with [facility], seen for initial visit & awaiting official first visit in Sept.. - Encourage remaining on cancellation list for earlier appointment.  Perimenopausal symptoms -  Scheduling difficulties with [facility]YN, on cancellation list. - Encourage remaining on cancellation list and continue scheduling efforts. - Offer referral to another GYN if difficulties persist.   Subjective:    Outpatient  Medications Prior to Visit  Medication Sig Dispense Refill   amLODipine  (NORVASC ) 5 MG tablet TAKE 1 TABLET(5 MG) BY MOUTH DAILY 90 tablet 0   buPROPion  ER (WELLBUTRIN  SR) 100 MG 12 hr tablet TAKE 1 TABLET(100 MG) BY MOUTH DAILY 60 tablet 5   cetirizine (ZYRTEC) 10 MG tablet Take 10 mg by mouth daily.     hydrochlorothiazide  (HYDRODIURIL ) 12.5 MG tablet TAKE 1 TABLET(12.5 MG) BY MOUTH EVERY MORNING 90 tablet 1   hydrOXYzine  (ATARAX ) 10 MG tablet Take 1 tablet (10 mg total) by mouth 3 (three) times daily as needed for anxiety. 45 tablet 5   ibuprofen (ADVIL) 800 MG tablet Take 800 mg by mouth every 6 (six) hours as needed.     No facility-administered medications prior to visit.   Past Medical History:  Diagnosis Date   Asthma with acute exacerbation 06/22/2015   Elevated blood pressure reading 07/26/2021   Encounter for screening mammogram for breast cancer 07/26/2021   Headache 11/09/2014   Migraine issues   Other tenosynovitis of hand and wrist 09/15/2008   Qualifier: Diagnosis of  By: Chanetta Comes MD, Jessica     Reactive airway disease 11/09/2014   Past Surgical History:  Procedure Laterality Date   ABLATION     TONSILLECTOMY     TUBAL LIGATION     No Known Allergies    Objective:    Physical Exam Vitals and nursing note reviewed.  Constitutional:      Appearance: Normal appearance.   Cardiovascular:     Rate and Rhythm: Normal rate and regular rhythm.  Pulmonary:     Effort: Pulmonary effort is normal.     Breath sounds: Normal  breath sounds.   Musculoskeletal:        General: Normal range of motion.   Skin:    General: Skin is warm and dry.   Neurological:     Mental Status: She is alert.   Psychiatric:        Mood and Affect: Mood normal.        Behavior: Behavior normal.    BP 126/88   Pulse 90   Temp 98.2 F (36.8 C) (Temporal)   Ht 5' 1 (1.549 m)   Wt 221 lb 12.8 oz (100.6 kg)   SpO2 94%   BMI 41.91 kg/m  Wt Readings from Last 3 Encounters:  12/17/23  221 lb 12.8 oz (100.6 kg)  10/29/23 215 lb (97.5 kg)  09/16/23 219 lb 9.6 oz (99.6 kg)      Versa Gore, NP

## 2023-12-18 ENCOUNTER — Ambulatory Visit (INDEPENDENT_AMBULATORY_CARE_PROVIDER_SITE_OTHER): Admitting: Family Medicine

## 2023-12-18 ENCOUNTER — Ambulatory Visit: Payer: Self-pay | Admitting: Family

## 2024-01-01 ENCOUNTER — Ambulatory Visit (INDEPENDENT_AMBULATORY_CARE_PROVIDER_SITE_OTHER): Admitting: Family Medicine

## 2024-03-10 ENCOUNTER — Ambulatory Visit (INDEPENDENT_AMBULATORY_CARE_PROVIDER_SITE_OTHER): Admitting: Internal Medicine

## 2024-03-10 ENCOUNTER — Encounter (INDEPENDENT_AMBULATORY_CARE_PROVIDER_SITE_OTHER): Payer: Self-pay | Admitting: Internal Medicine

## 2024-03-10 VITALS — BP 121/88 | HR 66 | Temp 98.4°F | Ht 62.0 in | Wt 217.0 lb

## 2024-03-10 DIAGNOSIS — I10 Essential (primary) hypertension: Secondary | ICD-10-CM | POA: Diagnosis not present

## 2024-03-10 DIAGNOSIS — R948 Abnormal results of function studies of other organs and systems: Secondary | ICD-10-CM | POA: Diagnosis not present

## 2024-03-10 DIAGNOSIS — Z6839 Body mass index (BMI) 39.0-39.9, adult: Secondary | ICD-10-CM | POA: Diagnosis not present

## 2024-03-10 DIAGNOSIS — Z8249 Family history of ischemic heart disease and other diseases of the circulatory system: Secondary | ICD-10-CM | POA: Insufficient documentation

## 2024-03-10 DIAGNOSIS — R0602 Shortness of breath: Secondary | ICD-10-CM

## 2024-03-10 DIAGNOSIS — R29818 Other symptoms and signs involving the nervous system: Secondary | ICD-10-CM | POA: Diagnosis not present

## 2024-03-10 DIAGNOSIS — J4540 Moderate persistent asthma, uncomplicated: Secondary | ICD-10-CM | POA: Diagnosis not present

## 2024-03-10 DIAGNOSIS — R5383 Other fatigue: Secondary | ICD-10-CM | POA: Diagnosis not present

## 2024-03-10 DIAGNOSIS — Z1331 Encounter for screening for depression: Secondary | ICD-10-CM | POA: Diagnosis not present

## 2024-03-10 DIAGNOSIS — E66812 Obesity, class 2: Secondary | ICD-10-CM

## 2024-03-10 NOTE — Assessment & Plan Note (Signed)
 Nicole Barber admits to daytime somnolence and admits to waking up still tired. Patient has a history of symptoms of daytime fatigue. Nicole Barber generally gets 7 or 8 hours of sleep per night, and states that she has generally restful sleep. Snoring is present. Apneic episodes are not present. Epworth Sleepiness Score is 6.  We will do further screening at the next office visit and referred for polysomnography if appropriate.

## 2024-03-10 NOTE — Assessment & Plan Note (Signed)
 Her blood pressure is close to goal.  She is currently on amlodipine  and hydrochlorothiazide .  She is on bupropion  which may increase blood pressure.

## 2024-03-10 NOTE — Progress Notes (Signed)
 [address] [place],  [place], [place] [address]  Office: [phone]  /  Fax: [phone]   Subjective   Initial Visit  Nicole Barber (MR# [id number]) is a 53 y.o. female who presents for evaluation and treatment of obesity and related comorbidities. Current BMI is Body mass index is 39.69 kg/m. Nicole Barber has been struggling with her weight for many years and has been unsuccessful in either losing weight, maintaining weight loss, or reaching her healthy weight goal.  Nicole Barber is currently in the action stage of change and ready to dedicate time achieving and maintaining a healthier weight. Nicole Barber is interested in becoming our patient and working on intensive lifestyle modifications including (but not limited to) diet and exercise for weight loss.  Weight history:  When asked how their weight has affected their life and health, she states: Contributed to medical problems, Having fatigue, and Having poor endurance  When asked what else they would like to accomplish? She states: Adopt a healthier eating pattern and lifestyle, Improve energy levels and physical activity, Improve existing medical conditions, and Improve quality of life  She starting to note weight gain during : teens.  Life events associated with weight gain include : marriage and job change.   Other contributing factors: family history of obesity, disruption of circadian rhythm / sleep disordered breathing, consumption of processed foods, use of obesogenic medications: Anticholinergics, reduced physical activity, menopause, slow metabolism for age, and sedentary job.  Their highest weight has been:  225 lbs.  Desired weight: 160  Previous weight-loss programs : Weight Watchers.  Their maximum weight loss was:  10 lbs.  Their greatest challenge with dieting: meal preparation and cooking.  Current or previous pharmacotherapy: None and Is interested in pharmacotherapy.  Response to medication: Never tried  medications Discussed the use of AI scribe software for clinical note transcription with the patient, who gave verbal consent to proceed.  History of Present Illness Nicole Barber is a 53 year old female with hypertension, asthma, and GERD who presents for medical weight management.  She has struggled with weight management, particularly since transitioning to remote work in 2020, leading to a more sedentary lifestyle. Her weight gain became more noticeable after this change, and she has been unable to lose weight despite efforts to exercise and improve her diet. Her metabolism was tested and found to burn 1,440 calories at rest.  Her current medications include hydroxyzine , taken once daily, and Wellbutrin . She has no history of diabetes, prediabetes, sleep apnea, cholesterol issues, or kidney disease.  She describes her diet as consisting of two meals a day, often skipping lunch due to being busy, and occasionally eating out two to three times a week. She recently stopped drinking regular soda, which she used to consume twice a day, and now drinks juice in the morning and water throughout the day. She tends towards emotional eating, particularly during stress or as a reward.  She reports snoring but no episodes of waking up gasping for air or being told she stops breathing during sleep. She feels tired upon waking in the morning. Her daughter has sleep apnea, and there is a family history of weight-related issues.  Her physical activity includes walking three to five times a week, often with her daughter who had a double lung transplant in October. She works as a [employer] loss adjuster, contributing to a sedentary lifestyle, but she has opportunities to take short walks during the day.  She has a family history of diabetes, high blood  pressure, high cholesterol, heart disease, and sudden death, with her father and grandfather both having died from massive heart attacks at a young age.  Her mother was diagnosed with breast cancer at age 24.  She has a history of three pregnancies without complications such as preeclampsia or gestational diabetes. She underwent a tubal ligation and has not had any weight loss surgery. She reports that she is going through menopause.    Nutritional History:  Current nutrition plan: None.  How many times do you eat outside the home: 2-4 per week  How often do they skip meals: skips lunch  What beverages do they drink: regular soda  and sweet tea .   Use of artificial sweetners : No  Food intolerances or dislikes: dairy.  Food triggers: Stress, Seeking reward, and To help comfort self.  Food cravings: Sugary  Do they struggle with excessive hunger or portion control : No    Physical Activity:  Current level of physical activity: Walking 30 minutes, three a week  Barriers to Exercise: no barriers   Past medical history includes:   Past Medical History:  Diagnosis Date   Anxiety    Asthma with acute exacerbation 06/22/2015   Back pain    Constipation    Depression    Edema of both lower extremities    Elevated blood pressure reading 07/26/2021   Encounter for screening mammogram for breast cancer 07/26/2021   GERD (gastroesophageal reflux disease)    Headache 11/09/2014   Migraine issues   HTN (hypertension)    Joint pain    Other tenosynovitis of hand and wrist 09/15/2008   Qualifier: Diagnosis of  By: Joyice MD, Harlene     Premenstrual symptom    Reactive airway disease 11/09/2014     Objective   BP 121/88   Pulse 66   Temp 98.4 F (36.9 C)   Ht 5' 2 (1.575 m)   Wt 217 lb (98.4 kg)   SpO2 97%   BMI 39.69 kg/m  She was weighed on the bioimpedance scale: Body mass index is 39.69 kg/m.    Anthropometrics:  Vitals Temp: 98.4 F (36.9 C) BP: 121/88 Pulse Rate: 66 SpO2: 97 %   Anthropometric Measurements Height: 5' 2 (1.575 m) Weight: 217 lb (98.4 kg) BMI (Calculated): 39.68 Starting  Weight: 217 lb Peak Weight: 217 lb Waist Measurement : 46 inches   Body Composition  Body Fat %: 45.8 % Fat Mass (lbs): 99.6 lbs Muscle Mass (lbs): 111.8 lbs Total Body Water (lbs): 82.8 lbs Visceral Fat Rating : 13   Other Clinical Data RMR: 1440 Fasting: yes Labs: yes Today's Visit #: 1 Starting Date: 03/10/24    Physical Exam:  General: She is overweight, cooperative, alert, well developed, and in no acute distress. PSYCH: Has normal mood, affect and thought process.   HEENT: EOMI, sclerae are anicteric. Lungs: Normal breathing effort, no conversational dyspnea. Extremities: No edema.  Neurologic: No gross sensory or motor deficits. No tremors or fasciculations noted.    Diagnostic Data Reviewed  EKG: Normal sinus rhythm, rate 75 bpm. No conduction abnormalities, abnormal Q waves or chamber enlargement.  Indirect Calorimeter completed today shows a VO2 of 208 and a REE of 1440.  Her calculated basal metabolic rate is 8342 thus her resting energy expenditure slower than calculated.  Depression Screen  Shaconda's PHQ-9 score was: 5.     03/10/2024    8:47 AM  Depression screen PHQ 2/9  Decreased Interest 0  Down, Depressed, Hopeless  0  PHQ - 2 Score 0  Altered sleeping 1  Tired, decreased energy 2  Change in appetite 1  Feeling bad or failure about yourself  1  Trouble concentrating 0  Moving slowly or fidgety/restless 0  Suicidal thoughts 0  PHQ-9 Score 5  Difficult doing work/chores Not difficult at all    Screening for Sleep Related Breathing Disorders  Bambi admits to daytime somnolence and admits to waking up still tired. Patient has a history of symptoms of daytime fatigue. Maple generally gets 7 or 8 hours of sleep per night, and states that she has generally restful sleep. Snoring is present. Apneic episodes are not present. Epworth Sleepiness Score is 6.   BMET    Component Value Date/Time   NA 136 12/17/2023 0959   K 4.0 12/17/2023 0959    CL 100 12/17/2023 0959   CO2 31 12/17/2023 0959   GLUCOSE 100 (H) 12/17/2023 0959   BUN 11 12/17/2023 0959   CREATININE 0.81 12/17/2023 0959   CALCIUM 9.5 12/17/2023 0959   GFRNONAA >90 03/11/2013 1628   GFRAA >90 03/11/2013 1628   No results found for: HGBA1C No results found for: INSULIN  CBC    Component Value Date/Time   WBC 5.9 08/28/2021 1055   RBC 5.15 (H) 08/28/2021 1055   HGB 14.4 08/28/2021 1055   HCT 44.2 08/28/2021 1055   PLT 265.0 08/28/2021 1055   MCV 85.7 08/28/2021 1055   MCH 29.9 03/11/2013 1628   MCHC 32.7 08/28/2021 1055   RDW 14.3 08/28/2021 1055   Iron/TIBC/Ferritin/ %Sat No results found for: IRON, TIBC, FERRITIN, IRONPCTSAT Lipid Panel     Component Value Date/Time   CHOL 156 10/01/2022 0933   TRIG 143.0 10/01/2022 0933   HDL 41.40 10/01/2022 0933   CHOLHDL 4 10/01/2022 0933   VLDL 28.6 10/01/2022 0933   LDLCALC 86 10/01/2022 0933   Hepatic Function Panel     Component Value Date/Time   PROT 6.9 10/01/2022 0933   ALBUMIN 4.1 10/01/2022 0933   AST 18 10/01/2022 0933   ALT 17 10/01/2022 0933   ALKPHOS 92 10/01/2022 0933   BILITOT 0.5 10/01/2022 0933      Component Value Date/Time   TSH 2.19 08/28/2021 1055     Assessment and Plan   TREATMENT PLAN FOR OBESITY:  Recommended Dietary Goals  Paticia is currently in the action stage of change. As such, her goal is to implement medically supervised obesity management plan.  She has agreed to implement: the Category 1 plan - 1000 kcal per day, prepackaged healthy meals for convenience, and 1-2 meal replacements a day for convenience   Behavioral Intervention  We discussed the following Behavioral Modification Strategies today: increasing lean protein intake to established goals, decreasing simple carbohydrates , increasing vegetables, increasing lower glycemic fruits, increasing fiber rich foods, avoiding skipping meals, increasing water intake, work on meal planning and  preparation, reading food labels , keeping healthy foods at home, identifying sources and decreasing liquid calories, decreasing eating out or consumption of processed foods, and making healthy choices when eating convenient foods, planning for success, and better snacking choices  Additional resources provided today: Handout on healthy eating and balanced plate, Handout on complex carbohydrates and lean sources of protein, Category 1 packet, and Handout principles of weight management  Recommended Physical Activity Goals  Janaye has been advised to work up to 150 minutes of moderate intensity aerobic activity a week and strengthening exercises 2-3 times per week for cardiovascular health, weight loss  maintenance and preservation of muscle mass.   She has agreed to :  Think about enjoyable ways to increase daily physical activity and overcoming barriers to exercise, Increase physical activity in their day and reduce sedentary time (increase NEAT)., Increase volume of physical activity to a goal of 240 minutes a week, Combine aerobic and strengthening exercises for efficiency and improved cardiometabolic health., and she has a Slo metabolic rate and therefore benefits tremendously from increasing physical activity particularly around endurance and strengthening.  Medical Interventions and Pharmacotherapy We will work on building a Therapist, art and behavioral strategies. We will discuss the role of pharmacotherapy as an adjunct at subsequent visits.   ASSOCIATED CONDITIONS ADDRESSED TODAY  Other Fatigue Jaina admits to daytime somnolence and admits to waking up still tired. Patient has a history of symptoms of daytime fatigue. Keajah generally gets 7 or 8 hours of sleep per night, and states that she has generally restful sleep. Snoring is present. Apneic episodes are not present. Epworth Sleepiness Score is 6. SABRA Jon does feel that her weight is causing her energy to be  lower than it should be. Fatigue may be related to obesity, depression or many other causes. Labs will be ordered, and in the meanwhile, Ruthanne will focus on self care including making healthy food choices, increasing physical activity and focusing on stress reduction.  Shortness of Breath Jorita notes increasing shortness of breath with physical activity and seems to be worsening over time with weight gain. She notes getting out of breath sooner with activity than she used to. This has not gotten worse recently. Argelia denies shortness of breath at rest or orthopnea.  Assessment & Plan Other fatigue  SOB (shortness of breath) on exertion  Depression screen  Family history of premature CAD Patient has a strong family history of cardiometabolic disease and premature CAD.  We will assess cardiovascular risk once we obtain serologies.  We will determine if she needs further restratification i.e. lipid particle size, LPA and coronary artery calcium score Abnormal metabolism Class 2 severe obesity with serious comorbidity and body mass index (BMI) of 39.0 to 39.9 in adult, unspecified obesity type (HCC) Contributing factors: Perimenopause, family history, sedentary job, slow metabolic rate, presence of obesogenic medication, chronic skipping of meals Class 2 obesity with a metabolic rate 13% slower than average for her age and height. Contributing factors include genetics, sedentary lifestyle, and dietary habits. Weight loss of 7-10% can significantly improve associated conditions like hypertension and asthma. - Initiate a 1000 calorie meal plan focusing on whole foods and adequate protein intake. - Encourage three balanced meals per day to maintain metabolic rate. - Advise increasing physical activity to boost metabolism. - Educate on the importance of avoiding liquid calories and processed foods. - Provide educational materials on meal planning and balanced diet. - Order blood work to assess  cholesterol, insulin  levels, A1c, and thyroid function.Calories so she is doing good she did decrease her losartan for a month he told her to but she has noticed elevated blood pressure readings today was one of them and she only one of the pound that she was on vacation   Essential hypertension Her blood pressure is close to goal.  She is currently on amlodipine  and hydrochlorothiazide .  She is on bupropion  which may increase blood pressure. Moderate persistent asthma without complication She is not on controller medications symptoms are intermittent.  She reports having asthma since she was young so may be allergic obesity has also been associated  with hypersensitivity of the airways so losing weight may actually improve her symptoms. Suspected sleep apnea Lovelee admits to daytime somnolence and admits to waking up still tired. Patient has a history of symptoms of daytime fatigue. Esty generally gets 7 or 8 hours of sleep per night, and states that she has generally restful sleep. Snoring is present. Apneic episodes are not present. Epworth Sleepiness Score is 6.  We will do further screening at the next office visit and referred for polysomnography if appropriate.     Follow-up  She was informed of the importance of frequent follow-up visits to maximize her success with intensive lifestyle modifications for her multiple health conditions. She was informed we would discuss her lab results at her next visit unless there is a critical issue that needs to be addressed sooner. Clovis agreed to keep her next visit at the agreed upon time to discuss these results.  Attestation Statement  This is the patient's intake visit at [facility]. The patient's Health Questionnaire was reviewed at length. Included in the packet: current and past health history, medications, allergies, ROS, gynecologic history (women only), surgical history, family history, social history, weight history, weight  loss surgery history (for those that have had weight loss surgery), nutritional evaluation, mood and food questionnaire, PHQ9, Epworth questionnaire, sleep habits questionnaire, patient life and health improvement goals questionnaire. These will all be scanned into the patient's chart under media.   During the visit, I independently reviewed the patient's EKG, previous labs, bioimpedance scale results, and indirect calorimetry results. I used this information to medically tailor a meal plan for the patient that will help her to lose weight and will improve her obesity-related conditions. I performed a medically necessary appropriate examination and/or evaluation. I discussed the assessment and treatment plan with the patient. The patient was provided an opportunity to ask questions and all were answered. The patient agreed with the plan and demonstrated an understanding of the instructions. Labs were ordered at this visit and will be reviewed at the next visit unless critical results need to be addressed immediately. Clinical information was updated and documented in the EMR.   In addition, they received basic education on identification of processed foods and reduction of these, different sources of lean proteins and complex carbohydrates and how to eat balanced by incorporation of whole foods.  Reviewed by clinician on day of visit: allergies, medications, problem list, medical history, surgical history, family history, social history, and previous encounter notes.  I have spent 55 minutes in the care of the patient today including: 6 minutes before the visit reviewing and preparing the chart. 41 minutes face-to-face assessing and reviewing listed medical problems as outlined in obesity care plan, providing nutritional and behavioral counseling on topics outlined in the obesity care plan, counseling regarding anti-obesity medication as outlined in obesity care plan, independently interpreting test  results and goals of care, as described in assessment and plan, reviewing and discussing biometric information and progress, and ordering diagnostics - see orders 8 minutes after the visit updating chart and documentation of encounter.       Lucas Parker, MD

## 2024-03-10 NOTE — Assessment & Plan Note (Signed)
 Contributing factors: Perimenopause, family history, sedentary job, slow metabolic rate, presence of obesogenic medication, chronic skipping of meals Class 2 obesity with a metabolic rate 13% slower than average for her age and height. Contributing factors include genetics, sedentary lifestyle, and dietary habits. Weight loss of 7-10% can significantly improve associated conditions like hypertension and asthma. - Initiate a 1000 calorie meal plan focusing on whole foods and adequate protein intake. - Encourage three balanced meals per day to maintain metabolic rate. - Advise increasing physical activity to boost metabolism. - Educate on the importance of avoiding liquid calories and processed foods. - Provide educational materials on meal planning and balanced diet. - Order blood work to assess cholesterol, insulin  levels, A1c, and thyroid function.Calories so she is doing good she did decrease her losartan for a month he told her to but she has noticed elevated blood pressure readings today was one of them and she only one of the pound that she was on vacation

## 2024-03-10 NOTE — Assessment & Plan Note (Signed)
 She is not on controller medications symptoms are intermittent.  She reports having asthma since she was young so may be allergic obesity has also been associated with hypersensitivity of the airways so losing weight may actually improve her symptoms.

## 2024-03-10 NOTE — Assessment & Plan Note (Signed)
 Patient has a strong family history of cardiometabolic disease and premature CAD.  We will assess cardiovascular risk once we obtain serologies.  We will determine if she needs further restratification i.e. lipid particle size, LPA and coronary artery calcium score

## 2024-03-11 LAB — LIPID PANEL WITH LDL/HDL RATIO
Cholesterol, Total: 172 mg/dL (ref 100–199)
HDL: 42 mg/dL (ref >39)
LDL Chol Calc (NIH): 105 mg/dL — ABNORMAL HIGH (ref 0–99)
LDL/HDL Ratio: 2.5 ratio (ref 0.0–3.2)
Triglycerides: 141 mg/dL (ref 0–149)
VLDL Cholesterol Cal: 25 mg/dL (ref 5–40)

## 2024-03-11 LAB — COMPREHENSIVE METABOLIC PANEL WITH GFR
ALT: 19 IU/L (ref 0–32)
AST: 21 IU/L (ref 0–40)
Albumin: 4.4 g/dL (ref 3.9–4.9)
Alkaline Phosphatase: 114 IU/L (ref 44–121)
BUN/Creatinine Ratio: 12 (ref 9–23)
BUN: 10 mg/dL (ref 6–24)
Bilirubin Total: 0.5 mg/dL (ref 0.0–1.2)
CO2: 23 mmol/L (ref 20–29)
Calcium: 9.4 mg/dL (ref 8.7–10.2)
Chloride: 99 mmol/L (ref 96–106)
Creatinine, Ser: 0.81 mg/dL (ref 0.57–1.00)
Globulin, Total: 2.8 g/dL (ref 1.5–4.5)
Glucose: 90 mg/dL (ref 70–99)
Potassium: 4.3 mmol/L (ref 3.5–5.2)
Sodium: 143 mmol/L (ref 134–144)
Total Protein: 7.2 g/dL (ref 6.0–8.5)
eGFR: 88 mL/min/1.73 (ref >59)

## 2024-03-11 LAB — CBC WITH DIFFERENTIAL/PLATELET
Basophils Absolute: 0 x10E3/uL (ref 0.0–0.2)
Basos: 1 %
EOS (ABSOLUTE): 0.1 x10E3/uL (ref 0.0–0.4)
Eos: 2 %
Hematocrit: 43.4 % (ref 34.0–46.6)
Hemoglobin: 14.2 g/dL (ref 11.1–15.9)
Immature Grans (Abs): 0 x10E3/uL (ref 0.0–0.1)
Immature Granulocytes: 0 %
Lymphocytes Absolute: 2.2 x10E3/uL (ref 0.7–3.1)
Lymphs: 34 %
MCH: 28.6 pg (ref 26.6–33.0)
MCHC: 32.7 g/dL (ref 31.5–35.7)
MCV: 87 fL (ref 79–97)
Monocytes Absolute: 0.6 x10E3/uL (ref 0.1–0.9)
Monocytes: 10 %
Neutrophils Absolute: 3.4 x10E3/uL (ref 1.4–7.0)
Neutrophils: 52 %
Platelets: 318 x10E3/uL (ref 150–450)
RBC: 4.97 x10E6/uL (ref 3.77–5.28)
RDW: 13.7 % (ref 11.7–15.4)
WBC: 6.3 x10E3/uL (ref 3.4–10.8)

## 2024-03-11 LAB — HEMOGLOBIN A1C
Est. average glucose Bld gHb Est-mCnc: 126 mg/dL
Hgb A1c MFr Bld: 6 % — ABNORMAL HIGH (ref 4.8–5.6)

## 2024-03-11 LAB — INSULIN, RANDOM: INSULIN: 28.1 u[IU]/mL — ABNORMAL HIGH (ref 2.6–24.9)

## 2024-03-11 LAB — TSH: TSH: 1.84 u[IU]/mL (ref 0.450–4.500)

## 2024-03-11 LAB — VITAMIN B12: Vitamin B-12: 900 pg/mL (ref 232–1245)

## 2024-03-11 LAB — VITAMIN D 25 HYDROXY (VIT D DEFICIENCY, FRACTURES): Vit D, 25-Hydroxy: 20.8 ng/mL — ABNORMAL LOW (ref 30.0–100.0)

## 2024-03-22 ENCOUNTER — Encounter: Payer: Self-pay | Admitting: Family

## 2024-03-23 ENCOUNTER — Other Ambulatory Visit: Payer: Self-pay

## 2024-03-23 DIAGNOSIS — I10 Essential (primary) hypertension: Secondary | ICD-10-CM

## 2024-03-23 DIAGNOSIS — Z23 Encounter for immunization: Secondary | ICD-10-CM | POA: Diagnosis not present

## 2024-03-23 DIAGNOSIS — F411 Generalized anxiety disorder: Secondary | ICD-10-CM

## 2024-03-23 MED ORDER — HYDROXYZINE HCL 10 MG PO TABS: 10.0000 mg | ORAL_TABLET | Freq: Three times a day (TID) | ORAL | 5 refills | Status: AC | PRN

## 2024-03-24 ENCOUNTER — Ambulatory Visit (INDEPENDENT_AMBULATORY_CARE_PROVIDER_SITE_OTHER): Admitting: Internal Medicine

## 2024-03-24 ENCOUNTER — Encounter (INDEPENDENT_AMBULATORY_CARE_PROVIDER_SITE_OTHER): Payer: Self-pay | Admitting: Internal Medicine

## 2024-03-24 VITALS — BP 130/87 | HR 93 | Temp 98.1°F | Ht 62.0 in | Wt 215.0 lb

## 2024-03-24 DIAGNOSIS — R7303 Prediabetes: Secondary | ICD-10-CM

## 2024-03-24 DIAGNOSIS — R948 Abnormal results of function studies of other organs and systems: Secondary | ICD-10-CM | POA: Diagnosis not present

## 2024-03-24 DIAGNOSIS — E66812 Obesity, class 2: Secondary | ICD-10-CM

## 2024-03-24 DIAGNOSIS — E559 Vitamin D deficiency, unspecified: Secondary | ICD-10-CM | POA: Diagnosis not present

## 2024-03-24 DIAGNOSIS — Z6839 Body mass index (BMI) 39.0-39.9, adult: Secondary | ICD-10-CM

## 2024-03-24 DIAGNOSIS — R29818 Other symptoms and signs involving the nervous system: Secondary | ICD-10-CM

## 2024-03-24 DIAGNOSIS — I10 Essential (primary) hypertension: Secondary | ICD-10-CM

## 2024-03-24 DIAGNOSIS — E88819 Insulin resistance, unspecified: Secondary | ICD-10-CM

## 2024-03-24 MED ORDER — VITAMIN D (ERGOCALCIFEROL) 1.25 MG (50000 UNIT) PO CAPS: 50000.0000 [IU] | ORAL_CAPSULE | ORAL | 0 refills | Status: DC

## 2024-03-24 MED ORDER — METFORMIN HCL ER 500 MG PO TB24: 500.0000 mg | ORAL_TABLET | Freq: Every day | ORAL | 0 refills | Status: DC

## 2024-03-24 NOTE — Progress Notes (Unsigned)
 Office: [phone]  /  Fax: [phone]  Weight Summary and Body Composition Analysis (BIA)  Vitals Temp: 98.1 F (36.7 C) BP: 130/87 Pulse Rate: 93 SpO2: 98 %   Anthropometric Measurements Height: 5' 2 (1.575 m) Weight: 215 lb (97.5 kg) BMI (Calculated): 39.31 Weight at Last Visit: 217 lb Weight Lost Since Last Visit: 2 lb Weight Gained Since Last Visit: 0 lb Starting Weight: 217 lb Total Weight Loss (lbs): 0 lb (0 kg) Peak Weight: 217 lb   Body Composition  Body Fat %: 41.6 % Fat Mass (lbs): 89.8 lbs Muscle Mass (lbs): 119.6 lbs Total Body Water (lbs): 84.6 lbs Visceral Fat Rating : 12   The 10-year ASCVD risk score (Arnett DK, et al., 2019) is: 4.6%   RMR: 1440  Today's Visit #: 2  Starting Date: 03/10/24   Subjective   Chief Complaint: Obesity  Interval History Discussed the use of AI scribe software for clinical note transcription with the patient, who gave verbal consent to proceed.  History of Present Illness QUETZALLI CLOS is a 53 year old female who presents for a medical weight management follow-up.  She is adhering to a 1000-calorie nutrition plan and has lost two pounds since her last visit. Despite avoiding sweet tea and juice, and drinking only water, she feels disappointed with the weight loss. She ensures she eats breakfast, lunch, dinner, and snacks in between meals.  She is engaging in physical activity by walking seven days a week for about thirty minutes each day. She reports feeling better, sleeping better, and having increased energy levels. She notes that her stomach is not protruding as much and her clothes are fitting looser.  She reports that she is entering menopause and has been told she has a slow metabolic rate. She was informed of changes in her body composition, including an increase in muscle mass and a decrease in body fat percentage. She finds weight loss challenging and acknowledges that weight gain can occur  quickly, such as after a weekend trip, but takes longer to lose.  Her blood work indicates normal kidney and liver function, normal electrolytes, and normal blood sugar levels. However, she has a vitamin D  deficiency and high insulin  levels, indicating hyperinsulinemia. Her A1c is 6.0, and she is managing her carbohydrate intake to aid weight loss. She is currently taking a diuretic and another unspecified medication for blood pressure, which was noted to be slightly elevated recently.  She experiences loud snoring, as noted by her family, but no observed apneas. She has daytime sleepiness and wakes up feeling unrefreshed.     Challenges affecting patient progress: slow metabolism for age and menopause.    Pharmacotherapy for weight management: She is currently taking no anti-obesity medication.   Assessment and Plan   Treatment Plan For Obesity:  Recommended Dietary Goals  Mikah is currently in the action stage of change. As such, her goal is to continue weight management plan. She has agreed to: continue current plan  Behavioral Health and Counseling  We discussed the following behavioral modification strategies today: continue to work on maintaining a reduced calorie state, getting the recommended amount of protein, incorporating whole foods, making healthy choices, staying well hydrated and practicing mindfulness when eating. and increase protein intake, fibrous foods (25 grams per day for women, 30 grams for men) and water to improve satiety and decrease hunger signals. .  Additional education and resources provided today: None  Recommended Physical Activity Goals  Jensen has been advised to  work up to 150 minutes of moderate intensity aerobic activity a week and strengthening exercises 2-3 times per week for cardiovascular health, weight loss maintenance and preservation of muscle mass.  She has agreed to :  Think about enjoyable ways to increase daily physical activity and  overcoming barriers to exercise, Increase physical activity in their day and reduce sedentary time (increase NEAT)., Increase volume of physical activity to a goal of 240 minutes a week, and Combine aerobic and strengthening exercises for efficiency and improved cardiometabolic health.  Medical Interventions and Pharmacotherapy  We discussed various medication options to help Taneasha with her weight loss efforts and we both agreed to : Start metformin  XR 500 mg 1 tablet daily for diabetes prevention and weight management  Associated Conditions Impacted by Obesity Treatment  Assessment & Plan Vitamin D  deficiency Most recent vitamin D  levels  Lab Results  Component Value Date   VD25OH 20.8 (L) 03/10/2024     Deficiency state associated with adiposity and may result in leptin resistance, weight gain and fatigue.  Plan: After discussion of benefits, alternative treatment options and side effects patient will be started on vitamin D2 50,000 units 1 tablet weekly for 3-4 months. for a treatment goal level of 50-60 mg/dl. Check levels at that time for response monitoring.  Prediabetes Most recent A1c is  Lab Results  Component Value Date   HGBA1C 6.0 (H) 03/10/2024    Patient aware of disease state and risk of progression. This may contribute to abnormal cravings, fatigue and diabetic complications without having diabetes.   We have discussed treatment options which include: losing 7 to 10% of body weight, increasing physical activity to a goal of 150 minutes a week at moderate intensity.  Advised to maintain a diet low on simple and processed carbohydrates.  She will be started on metformin  XR 500 mg once a day for pharmacoprophylaxis.  Suspected sleep apnea Suspected obstructive sleep apnea due to loud snoring and daytime sleepiness. Upper airway examination shows a narrow airway with a Mallampati of 4 neck circumference is 15.25 inches.  Discussed the potential impact of sleep apnea on  weight, blood pressure, and overall health. She is open to further evaluation. - Refer to [facility] for sleep study evaluation Class 2 severe obesity with serious comorbidity and body mass index (BMI) of 39.0 to 39.9 in adult, unspecified obesity type Abnormal metabolism Obesity with slow metabolic rate and menopausal transition Obesity management complicated by slow metabolic rate and menopausal transition. Following a 1000 calorie nutrition plan with good adherence, resulting in a two-pound weight loss. Body composition analysis shows a decrease in body fat and an increase in muscle mass. Walking seven days a week for about thirty minutes. Discussed the importance of self-compassion and focusing on body composition changes rather than just weight. Explained that minority women tend to lose weight slower and that healthy weight loss is 1-2 pounds per week. Emphasized the importance of exercise, protein intake, and meal frequency to boost metabolic rate. - Continue 1000 calorie nutrition plan - Encourage regular exercise, aiming for 115 minutes of cardio per week and 2-3 strengthening sessions per week - Monitor body composition changes - Encourage self-compassion and focus on non-scale victories Essential hypertension  Insulin  resistance Her HOMA-IR is 6.2 which is elevated. Optimal level < 1.9.   This is complex condition associated with genetics, ectopic fat and lifestyle factors. Insulin  resistance may result in increased fat storage, inhibition of the breakdown of fat, cause fluctuations in blood  sugar leading to energy crashes and increased cravings for sugary or high carb foods and cause metabolic slowdown making it difficult to lose weight.  This may result in additional weight gain and lead to pre-diabetes and diabetes if untreated. In addition, hyperinsulinemia increases cardiovascular risk, chronic inflammatory response and may increase the risk of obesity related  malignancies.  Lab Results  Component Value Date   HGBA1C 6.0 (H) 03/10/2024   Lab Results  Component Value Date   INSULIN  28.1 (H) 03/10/2024   Lab Results  Component Value Date   GLUCOSE 90 03/10/2024   GLUCOSE 108 (H) 03/11/2013    We reviewed treatment options which include losing 7 to 10% of body weight, increasing volume of physical activity and maintaining a diet low in saturated fats and with a low glycemic load.  Patient has also been educated on the carb insulin  model of obesity.  Start metformin  for pharmacoprophylaxis          Objective   Physical Exam:  Blood pressure 130/87, pulse 93, temperature 98.1 F (36.7 C), height 5' 2 (1.575 m), weight 215 lb (97.5 kg), SpO2 98%. Body mass index is 39.32 kg/m.  General: She is overweight, cooperative, alert, well developed, and in no acute distress. PSYCH: Has normal mood, affect and thought process.   HEENT: EOMI, sclerae are anicteric. Lungs: Normal breathing effort, no conversational dyspnea. Extremities: No edema.  Neurologic: No gross sensory or motor deficits. No tremors or fasciculations noted.    Diagnostic Data Reviewed:  BMET    Component Value Date/Time   NA 143 03/10/2024 1021   K 4.3 03/10/2024 1021   CL 99 03/10/2024 1021   CO2 23 03/10/2024 1021   GLUCOSE 90 03/10/2024 1021   GLUCOSE 100 (H) 12/17/2023 0959   BUN 10 03/10/2024 1021   CREATININE 0.81 03/10/2024 1021   CALCIUM 9.4 03/10/2024 1021   GFRNONAA >90 03/11/2013 1628   GFRAA >90 03/11/2013 1628   Lab Results  Component Value Date   HGBA1C 6.0 (H) 03/10/2024   Lab Results  Component Value Date   INSULIN  28.1 (H) 03/10/2024   Lab Results  Component Value Date   TSH 1.840 03/10/2024   CBC    Component Value Date/Time   WBC 6.3 03/10/2024 1021   WBC 5.9 08/28/2021 1055   RBC 4.97 03/10/2024 1021   RBC 5.15 (H) 08/28/2021 1055   HGB 14.2 03/10/2024 1021   HCT 43.4 03/10/2024 1021   PLT 318 03/10/2024 1021   MCV  87 03/10/2024 1021   MCH 28.6 03/10/2024 1021   MCH 29.9 03/11/2013 1628   MCHC 32.7 03/10/2024 1021   MCHC 32.7 08/28/2021 1055   RDW 13.7 03/10/2024 1021   Iron Studies No results found for: IRON, TIBC, FERRITIN, IRONPCTSAT Lipid Panel     Component Value Date/Time   CHOL 172 03/10/2024 1021   TRIG 141 03/10/2024 1021   HDL 42 03/10/2024 1021   CHOLHDL 4 10/01/2022 0933   VLDL 28.6 10/01/2022 0933   LDLCALC 105 (H) 03/10/2024 1021   Hepatic Function Panel     Component Value Date/Time   PROT 7.2 03/10/2024 1021   ALBUMIN 4.4 03/10/2024 1021   AST 21 03/10/2024 1021   ALT 19 03/10/2024 1021   ALKPHOS 114 03/10/2024 1021   BILITOT 0.5 03/10/2024 1021      Component Value Date/Time   TSH 1.840 03/10/2024 1021   Nutritional Lab Results  Component Value Date   VD25OH 20.8 (L) 03/10/2024  Medications: Outpatient Encounter Medications as of 03/24/2024  Medication Sig   amLODipine  (NORVASC ) 5 MG tablet Take 1 tablet (5 mg total) by mouth daily.   buPROPion  ER (WELLBUTRIN  SR) 100 MG 12 hr tablet TAKE 1 TABLET(100 MG) BY MOUTH DAILY   cetirizine (ZYRTEC) 10 MG tablet Take 10 mg by mouth daily.   Cyanocobalamin (VITAMIN B 12 PO) Take 1,000 mcg by mouth daily at 12 noon.   hydrochlorothiazide  (HYDRODIURIL ) 12.5 MG tablet TAKE 1 TABLET(12.5 MG) BY MOUTH EVERY MORNING   hydrOXYzine  (ATARAX ) 10 MG tablet Take 1 tablet (10 mg total) by mouth 3 (three) times daily as needed for anxiety.   ibuprofen (ADVIL) 800 MG tablet Take 800 mg by mouth every 6 (six) hours as needed. (Patient not taking: Reported on 03/24/2024)   [DISCONTINUED] hydrOXYzine  (ATARAX ) 10 MG tablet Take 1 tablet (10 mg total) by mouth 3 (three) times daily as needed for anxiety.   No facility-administered encounter medications on file as of 03/24/2024.     Follow-Up   No follow-ups on file.SABRA She was informed of the importance of frequent follow up visits to maximize her success with intensive lifestyle  modifications for her multiple health conditions.  Attestation Statement   Reviewed by clinician on day of visit: allergies, medications, problem list, medical history, surgical history, family history, social history, and previous encounter notes.     Lucas Parker, MD

## 2024-03-25 ENCOUNTER — Other Ambulatory Visit (INDEPENDENT_AMBULATORY_CARE_PROVIDER_SITE_OTHER): Payer: Self-pay | Admitting: Internal Medicine

## 2024-03-25 DIAGNOSIS — R7303 Prediabetes: Secondary | ICD-10-CM

## 2024-03-25 DIAGNOSIS — E88819 Insulin resistance, unspecified: Secondary | ICD-10-CM | POA: Insufficient documentation

## 2024-03-25 DIAGNOSIS — E559 Vitamin D deficiency, unspecified: Secondary | ICD-10-CM

## 2024-03-25 NOTE — Assessment & Plan Note (Signed)
 Obesity with slow metabolic rate and menopausal transition Obesity management complicated by slow metabolic rate and menopausal transition. Following a 1000 calorie nutrition plan with good adherence, resulting in a two-pound weight loss. Body composition analysis shows a decrease in body fat and an increase in muscle mass. Walking seven days a week for about thirty minutes. Discussed the importance of self-compassion and focusing on body composition changes rather than just weight. Explained that minority women tend to lose weight slower and that healthy weight loss is 1-2 pounds per week. Emphasized the importance of exercise, protein intake, and meal frequency to boost metabolic rate. - Continue 1000 calorie nutrition plan - Encourage regular exercise, aiming for 115 minutes of cardio per week and 2-3 strengthening sessions per week - Monitor body composition changes - Encourage self-compassion and focus on non-scale victories

## 2024-03-25 NOTE — Assessment & Plan Note (Signed)
 Suspected obstructive sleep apnea due to loud snoring and daytime sleepiness. Upper airway examination shows a narrow airway with a Mallampati of 4 neck circumference is 15.25 inches.  Discussed the potential impact of sleep apnea on weight, blood pressure, and overall health. She is open to further evaluation. - Refer to [facility] for sleep study evaluation

## 2024-03-25 NOTE — Assessment & Plan Note (Signed)
 Her HOMA-IR is 6.2 which is elevated. Optimal level < 1.9.   This is complex condition associated with genetics, ectopic fat and lifestyle factors. Insulin  resistance may result in increased fat storage, inhibition of the breakdown of fat, cause fluctuations in blood sugar leading to energy crashes and increased cravings for sugary or high carb foods and cause metabolic slowdown making it difficult to lose weight.  This may result in additional weight gain and lead to pre-diabetes and diabetes if untreated. In addition, hyperinsulinemia increases cardiovascular risk, chronic inflammatory response and may increase the risk of obesity related malignancies.  Lab Results  Component Value Date   HGBA1C 6.0 (H) 03/10/2024   Lab Results  Component Value Date   INSULIN  28.1 (H) 03/10/2024   Lab Results  Component Value Date   GLUCOSE 90 03/10/2024   GLUCOSE 108 (H) 03/11/2013    We reviewed treatment options which include losing 7 to 10% of body weight, increasing volume of physical activity and maintaining a diet low in saturated fats and with a low glycemic load.  Patient has also been educated on the carb insulin  model of obesity.  Start metformin  for pharmacoprophylaxis

## 2024-04-14 ENCOUNTER — Ambulatory Visit (INDEPENDENT_AMBULATORY_CARE_PROVIDER_SITE_OTHER): Admitting: Internal Medicine

## 2024-06-14 ENCOUNTER — Other Ambulatory Visit: Payer: Self-pay | Admitting: Family

## 2024-06-14 DIAGNOSIS — I10 Essential (primary) hypertension: Secondary | ICD-10-CM

## 2024-06-17 ENCOUNTER — Encounter: Payer: Self-pay | Admitting: Family

## 2024-06-17 ENCOUNTER — Ambulatory Visit (INDEPENDENT_AMBULATORY_CARE_PROVIDER_SITE_OTHER): Admitting: Family

## 2024-06-17 VITALS — BP 138/87 | HR 94 | Temp 97.0°F | Ht 62.0 in | Wt 221.1 lb

## 2024-06-17 DIAGNOSIS — Z23 Encounter for immunization: Secondary | ICD-10-CM | POA: Diagnosis not present

## 2024-06-17 DIAGNOSIS — F411 Generalized anxiety disorder: Secondary | ICD-10-CM | POA: Diagnosis not present

## 2024-06-17 DIAGNOSIS — I10 Essential (primary) hypertension: Secondary | ICD-10-CM

## 2024-06-17 DIAGNOSIS — Z1211 Encounter for screening for malignant neoplasm of colon: Secondary | ICD-10-CM

## 2024-06-17 MED ORDER — BUPROPION HCL ER (SR) 100 MG PO TB12: 100.0000 mg | ORAL_TABLET | Freq: Every day | ORAL | 1 refills | Status: AC

## 2024-06-17 MED ORDER — HYDROCHLOROTHIAZIDE 12.5 MG PO TABS: 12.5000 mg | ORAL_TABLET | Freq: Every morning | ORAL | 1 refills | Status: AC

## 2024-06-17 MED ORDER — AMLODIPINE BESYLATE 5 MG PO TABS: 5.0000 mg | ORAL_TABLET | Freq: Every day | ORAL | 1 refills | Status: AC

## 2024-06-17 NOTE — Progress Notes (Signed)
 Patient ID: Nicole Barber, female    DOB: [birth year], 53 y.o.   MRN: [id number]  Chief Complaint  Patient presents with   Essential hypertension    Follow up   Morbid obesity   Generalized anxiety disorder  Discussed the use of AI scribe software for clinical note transcription with the patient, who gave verbal consent to proceed.  History of Present Illness Nicole Barber is a 53 year old female with anxiety and hypertension who presents for follow-up.  She manages her hypertension with amlodipine  and hydrochlorothiazide  and notes dry mouth and dehydration symptoms when she is not well hydrated.  Her weight has increased by six pounds since September. She follows with [facility] and is taking metformin , B12, and vitamin D  without issues.  For anxiety, she takes hydroxyzine  and once-daily bupropion . Her anxiety has worsened with recent family stressors, including her immunocompromised daughter's recent serious illness and the first holidays since her mother's death last year.  Assessment & Plan Generalized anxiety disorder Managed with hydroxyzine  10mg  prn and bupropion  100mg  SR qam. Denies any SE. - Continue hydroxyzine  as prescribed, no refill needed today. - Continue bupropion  once daily, sending refill. - F/U in 6mos  Essential hypertension Well-controlled with amlodipine  5mg  qpm and hydrochlorothiazide  12.5mg  qam. Blood pressure normal. Emphasized hydration to prevent dry mouth. - Continue amlodipine  and hydrochlorothiazide , refills sent. - Encouraged hydration with at least two liters of fluid daily. - F/U in 6 mos  General Health Maintenance Received first shingles vaccine dose. Discussed side effects. Advised to avoid Tylenol or ibuprofen if possible. Referral for colonoscopy planned. - Schedule second dose of shingles vaccine in 1-2 months. - Referred for colonoscopy.   Subjective:    Outpatient Medications Prior to Visit   Medication Sig Dispense Refill   amLODipine  (NORVASC ) 5 MG tablet Take 1 tablet (5 mg total) by mouth daily. 90 tablet 1   buPROPion  ER (WELLBUTRIN  SR) 100 MG 12 hr tablet TAKE 1 TABLET(100 MG) BY MOUTH DAILY 180 tablet 1   cetirizine (ZYRTEC) 10 MG tablet Take 10 mg by mouth daily.     Cyanocobalamin (VITAMIN B 12 PO) Take 1,000 mcg by mouth daily at 12 noon.     hydrochlorothiazide  (HYDRODIURIL ) 12.5 MG tablet TAKE 1 TABLET(12.5 MG) BY MOUTH EVERY MORNING 30 tablet 0   hydrOXYzine  (ATARAX ) 10 MG tablet Take 1 tablet (10 mg total) by mouth 3 (three) times daily as needed for anxiety. 45 tablet 5   ibuprofen (ADVIL) 800 MG tablet Take 800 mg by mouth every 6 (six) hours as needed.     metFORMIN  (GLUCOPHAGE -XR) 500 MG 24 hr tablet TAKE 1 TABLET(500 MG) BY MOUTH DAILY WITH BREAKFAST 90 tablet 0   Vitamin D , Ergocalciferol , (DRISDOL ) 1.25 MG (50000 UNIT) CAPS capsule Take 1 capsule (50,000 Units total) by mouth every 7 (seven) days. 16 capsule 0   No facility-administered medications prior to visit.   Past Medical History:  Diagnosis Date   Acute maxillary sinusitis 07/11/2012   Acute tracheitis without obstruction 07/16/2014   Acute upper respiratory infection 07/11/2012   Anxiety    Asthma with acute exacerbation 06/22/2015   Back pain    Constipation    Depression    Edema of both lower extremities    Elevated blood pressure reading 07/26/2021   Encounter for screening mammogram for breast cancer 07/26/2021   GERD (gastroesophageal reflux disease)    Headache 11/09/2014   Migraine issues   HTN (hypertension)  Joint pain    Other tenosynovitis of hand and wrist 09/15/2008   Qualifier: Diagnosis of  By: Joyice MD, Jessica     Postnasal drip 07/11/2012   Premenstrual symptom    Reactive airway disease 11/09/2014   Past Surgical History:  Procedure Laterality Date   ABLATION     TONSILLECTOMY     TUBAL LIGATION     Allergies[1]    Objective:    Physical Exam Vitals and  nursing note reviewed.  Constitutional:      Appearance: Normal appearance. She is obese.  Cardiovascular:     Rate and Rhythm: Normal rate and regular rhythm.  Pulmonary:     Effort: Pulmonary effort is normal.     Breath sounds: Normal breath sounds.  Musculoskeletal:        General: Normal range of motion.  Skin:    General: Skin is warm and dry.  Neurological:     Mental Status: She is alert.  Psychiatric:        Mood and Affect: Mood normal.        Behavior: Behavior normal.    BP 138/87 (BP Location: Left Arm, Patient Position: Sitting, Cuff Size: Large)   Pulse 94   Temp (!) 97 F (36.1 C) (Temporal)   Ht 5' 2 (1.575 m)   Wt 221 lb 2 oz (100.3 kg)   SpO2 99%   BMI 40.44 kg/m  Wt Readings from Last 3 Encounters:  06/17/24 221 lb 2 oz (100.3 kg)  03/24/24 215 lb (97.5 kg)  03/10/24 217 lb (98.4 kg)      Keithen Capo, NP     [1] No Known Allergies

## 2024-06-22 ENCOUNTER — Other Ambulatory Visit (INDEPENDENT_AMBULATORY_CARE_PROVIDER_SITE_OTHER): Payer: Self-pay | Admitting: Internal Medicine

## 2024-06-22 DIAGNOSIS — E559 Vitamin D deficiency, unspecified: Secondary | ICD-10-CM

## 2024-06-22 DIAGNOSIS — R7303 Prediabetes: Secondary | ICD-10-CM

## 2024-06-29 ENCOUNTER — Encounter: Payer: Self-pay | Admitting: Family

## 2024-06-30 ENCOUNTER — Other Ambulatory Visit: Payer: Self-pay

## 2024-06-30 DIAGNOSIS — E559 Vitamin D deficiency, unspecified: Secondary | ICD-10-CM

## 2024-06-30 DIAGNOSIS — R7303 Prediabetes: Secondary | ICD-10-CM

## 2024-06-30 MED ORDER — VITAMIN D (ERGOCALCIFEROL) 1.25 MG (50000 UNIT) PO CAPS: 50000.0000 [IU] | ORAL_CAPSULE | ORAL | 0 refills | Status: AC

## 2024-06-30 MED ORDER — METFORMIN HCL ER 500 MG PO TB24: 500.0000 mg | ORAL_TABLET | Freq: Every day | ORAL | 0 refills | Status: AC

## 2024-07-21 ENCOUNTER — Encounter (INDEPENDENT_AMBULATORY_CARE_PROVIDER_SITE_OTHER): Payer: Self-pay | Admitting: *Deleted

## 2024-08-18 ENCOUNTER — Ambulatory Visit

## 2024-09-21 ENCOUNTER — Encounter: Admitting: Obstetrics & Gynecology
